# Patient Record
Sex: Female | Born: 1995 | Race: Black or African American | Hispanic: No | Marital: Single | Smoking: Never smoker
Health system: Southern US, Community
[De-identification: ages and names within clinical notes are randomized; demographics above are authoritative.]

## PROBLEM LIST (undated history)

## (undated) DIAGNOSIS — E282 Polycystic ovarian syndrome: Secondary | ICD-10-CM

## (undated) DIAGNOSIS — J45909 Unspecified asthma, uncomplicated: Secondary | ICD-10-CM

## (undated) HISTORY — PX: TONSILLECTOMY: SUR1361

## (undated) HISTORY — DX: Unspecified asthma, uncomplicated: J45.909

---
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
## 2013-03-23 ENCOUNTER — Emergency Department (HOSPITAL_COMMUNITY)
Admission: EM | Admit: 2013-03-23 | Discharge: 2013-03-23 | Disposition: A | Discharge status: Home / Self Care | Payer: 59 | Source: Home / Self Care | Attending: Family Medicine | Admitting: Family Medicine

## 2013-03-23 ENCOUNTER — Encounter (HOSPITAL_COMMUNITY): Payer: Self-pay | Admitting: Emergency Medicine

## 2013-03-23 ENCOUNTER — Emergency Department (INDEPENDENT_AMBULATORY_CARE_PROVIDER_SITE_OTHER): Payer: 59

## 2013-03-23 DIAGNOSIS — R079 Chest pain, unspecified: Secondary | ICD-10-CM

## 2013-03-23 MED ORDER — ALBUTEROL SULFATE (5 MG/ML) 0.5% IN NEBU
5.0000 mg | INHALATION_SOLUTION | Freq: Once | RESPIRATORY_TRACT | Status: AC
  Administered 2013-03-23: 5 mg via RESPIRATORY_TRACT

## 2013-03-23 MED ORDER — ALBUTEROL SULFATE (5 MG/ML) 0.5% IN NEBU
INHALATION_SOLUTION | RESPIRATORY_TRACT | Status: AC
  Filled 2013-03-23: qty 1

## 2013-03-23 MED ORDER — IPRATROPIUM BROMIDE 0.02 % IN SOLN
RESPIRATORY_TRACT | Status: AC
  Filled 2013-03-23: qty 2.5

## 2013-03-23 MED ORDER — IPRATROPIUM BROMIDE 0.02 % IN SOLN
0.5000 mg | Freq: Once | RESPIRATORY_TRACT | Status: AC
  Administered 2013-03-23: 0.5 mg via RESPIRATORY_TRACT

## 2013-03-23 NOTE — ED Notes (Signed)
C/o chest pain for a month feels as if something is closing States she does get SOB States she has been having headaches almost everday She has migraine medication, tries to sleep off headache, and tried other OTC medications.

## 2013-03-23 NOTE — ED Provider Notes (Signed)
Teresa Durham is a 17 y.o. female who presents to Urgent Care today for chest pain and tightness for one month. This is associated with some shortness of breath. She notes decreasing exercise tolerance as well. She has a history of asthma but says that her symptoms are not consistent with prior asthma exacerbation. She's tried albuterol inhaler which did not help. She denies any palpitations or syncope or swelling. The pain is central does not radiate. The pain is not very exertional in nature however she does note decreased tolerance to exertion. She notes that she becomes fatigued with exertion.   History reviewed. No pertinent past medical history. History  Substance Use Topics  . Smoking status: Not on file  . Smokeless tobacco: Not on file  . Alcohol Use: Not on file   ROS as above Medications reviewed. No current facility-administered medications for this encounter.   No current outpatient prescriptions on file.    Exam:  BP 105/70  Pulse 83  Temp(Src) 98.4 F (36.9 C) (Oral)  Resp 16  SpO2 98%  LMP 02/17/2013 Gen: Well NAD HEENT: EOMI,  MMM Lungs: CTABL Nl WOB Heart: RRR no MRG Abd: NABS, NT, ND Exts: Non edematous BL  LE, warm and well perfused.   The patient was given DuoNeb nebulizer treatment and had no subjective or objective improvement.   No results found for this or any previous visit (from the past 24 hour(s)). Dg Chest 2 View  03/23/2013   CLINICAL DATA:  Chest pain with history of asthma.  EXAM: CHEST  2 VIEW  COMPARISON:  None.  FINDINGS: The lungs are borderline hyperinflated. There is no focal infiltrate. There is no pneumothorax nor pneumomediastinum. There is no pleural effusion. The cardiopericardial silhouette is normal in size. The pulmonary vascularity is not engorged. The observed portions of the bony thorax are normal.  IMPRESSION: There is borderline hyperinflation which likely reflects the patient's history of reactive airway disease. There is no  evidence of pneumonia.   Electronically Signed   By: David  [place]   On: 03/23/2013 16:23    Twelve-lead EKG shows normal sinus rhythm 82 beats per minute. No ST segment changes. Otherwise normal.    Assessment and Plan: 17 y.o. female with chest pain and tightness with decreasing exercise tolerance over the last month.  This is somewhat concerning for cardiac etiology.  If the patient would benefit from consultation with cardiology. She may ultimately require echocardiogram or stress test.  It is possible her symptoms are due to a pulmonary etiology however she has been using albuterol both at home and in the urgent care setting and had no benefit.  She is an appointment with cardiology on November 18th at 8:15 AM.  She will return sooner if worsening or present to the emergency room.  Discussed warning signs or symptoms. Please see discharge instructions. Patient expresses understanding.      Rodolph Bong, MD 03/24/13 0730

## 2013-04-04 ENCOUNTER — Ambulatory Visit (INDEPENDENT_AMBULATORY_CARE_PROVIDER_SITE_OTHER): Payer: 59 | Admitting: Internal Medicine

## 2013-04-04 ENCOUNTER — Encounter: Payer: Self-pay | Admitting: Internal Medicine

## 2013-04-04 VITALS — BP 100/62 | HR 78 | Ht 63.0 in | Wt 157.3 lb

## 2013-04-04 DIAGNOSIS — R011 Cardiac murmur, unspecified: Secondary | ICD-10-CM | POA: Insufficient documentation

## 2013-04-04 DIAGNOSIS — M940 Chondrocostal junction syndrome [Tietze]: Secondary | ICD-10-CM | POA: Insufficient documentation

## 2013-04-04 DIAGNOSIS — R0609 Other forms of dyspnea: Secondary | ICD-10-CM

## 2013-04-04 DIAGNOSIS — R0600 Dyspnea, unspecified: Secondary | ICD-10-CM | POA: Insufficient documentation

## 2013-04-04 DIAGNOSIS — R0789 Other chest pain: Secondary | ICD-10-CM | POA: Insufficient documentation

## 2013-04-04 DIAGNOSIS — J45909 Unspecified asthma, uncomplicated: Secondary | ICD-10-CM | POA: Insufficient documentation

## 2013-04-04 MED ORDER — IBUPROFEN 800 MG PO TABS: 800.0000 mg | ORAL_TABLET | Freq: Two times a day (BID) | ORAL | Status: DC | PRN

## 2013-04-04 NOTE — Progress Notes (Signed)
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 She is studying physical therapy.  She reports a few weeks ago that she developed tightness in her left chest with some associated shortness of breath. She is felt to have a history of asthma since childhood and was prescribed Qvar for inflammation in the chest. With regards to her chest discomfort she describes it as sharp and sore in the left chest in between the ribs at the angle of the costal cartilage.  The symptoms of discomfort are provoked with pain or pressure in that area and with movement. They're not necessarily worse with breathing. Her symptoms are not worse with exercise and present more often consistently at rest. She's had no real improvement in her symptoms over the past several weeks. She had an x-ray which showed possible hyperinflation of the lungs but the heart was not borderline enlarged. Her EKG did not show any evidence of left ventricular hypertrophy. There is no family history of sudden cardiac death or significant coronary disease, especially prematurely in the family.  The pain is rated a 2-3/10 and is more of a dull ache.  PMHx:  Past Medical History  Diagnosis Date  . Asthma     Past Surgical History  Procedure Laterality Date  . Tonsillectomy      FAMHx:  No family history on file.  SOCHx:   reports that she has never smoked. She has never used smokeless tobacco. She reports that she does not drink alcohol or use illicit drugs.  ALLERGIES:  No Known Allergies  ROS: A comprehensive review of systems was negative except for: Respiratory: positive for asthma and pleurisy/chest pain Cardiovascular: positive for chest pressure/discomfort  HOME MEDS: Current Outpatient Prescriptions  Medication Sig Dispense Refill  . QVAR 80  MCG/ACT inhaler Inhale 1 puff into the lungs 2 (two) times daily.      Marland Kitchen ibuprofen (ADVIL,MOTRIN) 800 MG tablet Take 1 tablet (800 mg total) by mouth 2 (two) times daily as needed.  60 tablet  3   No current facility-administered medications for this visit.    LABS/IMAGING: No results found for this or any previous visit (from the past 48 hour(s)). No results found.  VITALS: BP 100/62  Pulse 78  Ht 5\' 3"  (1.6 m)  Wt 157 lb 4.8 oz (71.351 kg)  BMI 27.87 kg/m2  LMP 02/17/2013  EXAM: General appearance: alert and no distress Neck: no carotid bruit and no JVD Lungs: clear to auscultation bilaterally, there is reproducible chest discomfort along the left sternal margin Heart: regular rate and rhythm, S1, S2 normal, 2/6 diastolic murmur at the RUSB, no click, rub or gallop Abdomen: soft, non-tender; bowel sounds normal; no masses,  no organomegaly Extremities: extremities normal, atraumatic, no cyanosis or edema Pulses: 2+ and symmetric Skin: Skin color, texture, turgor normal. No rashes or lesions Neurologic: Grossly normal Psych: Normal, pleasant  EKG: Normal sinus rhythm at 78  ASSESSMENT: 1. Probable costochondritis 2. Asthma on Qvar 3. Aortic murmur - possible AI  PLAN: 1.   [name] most likely has a viral costochondritis. I recommended that she take ibuprofen 800 mg twice daily for at least one week and that she take Zantac in addition to this given her history of reflux to try to protect her stomach. Hopefully this will help  reduce the inflammation in her chest wall. Incidentally, she does have an aortic murmur which sounded diastolic. I would like to check an echocardiogram to evaluate for possible mild aortic insufficiency.  This will also help rule out any possible cardiomegaly as this was a concern for her visit today, or possible pericardial effusion which could be causing some of her chest discomfort.  Plan to see her back in 2 weeks to review the results of her  echocardiogram.  Thanks again for the referral.  Chrystie Nose, MD, [facility] Attending Cardiologist [facility]  Fayez Sturgell C 04/04/2013, 5:02 PM

## 2013-04-04 NOTE — Patient Instructions (Signed)
Your physician has requested that you have an echocardiogram. Echocardiography is a painless test that uses sound waves to create images of your heart. It provides your doctor with information about the size and shape of your heart and how well your heart's chambers and valves are working. This procedure takes approximately one hour. There are no restrictions for this procedure.  Take ibuprofen 800mg  twice daily as needed - this has been sent to your pharmacy.  Dr. Rennis Golden recommended that you try Zantac 150mg  OTC daily for reflux symptoms.   Your physician recommends that you schedule a follow-up appointment in: 2-3 weeks, after your test.

## 2013-04-24 ENCOUNTER — Ambulatory Visit (HOSPITAL_COMMUNITY): Payer: 59

## 2013-05-01 ENCOUNTER — Ambulatory Visit (HOSPITAL_COMMUNITY): Payer: 59

## 2013-05-29 ENCOUNTER — Ambulatory Visit: Payer: 59 | Admitting: Internal Medicine

## 2013-09-29 ENCOUNTER — Emergency Department (HOSPITAL_COMMUNITY): Payer: 59

## 2013-09-29 ENCOUNTER — Encounter (HOSPITAL_COMMUNITY): Payer: Self-pay | Admitting: Emergency Medicine

## 2013-09-29 ENCOUNTER — Emergency Department (HOSPITAL_COMMUNITY)
Admission: EM | Admit: 2013-09-29 | Discharge: 2013-09-29 | Disposition: A | Discharge status: Home / Self Care | Payer: 59 | Attending: Emergency Medicine | Admitting: Emergency Medicine

## 2013-09-29 DIAGNOSIS — S99929A Unspecified injury of unspecified foot, initial encounter: Secondary | ICD-10-CM

## 2013-09-29 DIAGNOSIS — Z041 Encounter for examination and observation following transport accident: Secondary | ICD-10-CM

## 2013-09-29 DIAGNOSIS — Y92410 Unspecified street and highway as the place of occurrence of the external cause: Secondary | ICD-10-CM | POA: Insufficient documentation

## 2013-09-29 DIAGNOSIS — S8990XA Unspecified injury of unspecified lower leg, initial encounter: Secondary | ICD-10-CM | POA: Insufficient documentation

## 2013-09-29 DIAGNOSIS — S46909A Unspecified injury of unspecified muscle, fascia and tendon at shoulder and upper arm level, unspecified arm, initial encounter: Secondary | ICD-10-CM | POA: Insufficient documentation

## 2013-09-29 DIAGNOSIS — S0990XA Unspecified injury of head, initial encounter: Secondary | ICD-10-CM | POA: Insufficient documentation

## 2013-09-29 DIAGNOSIS — Y9389 Activity, other specified: Secondary | ICD-10-CM | POA: Insufficient documentation

## 2013-09-29 DIAGNOSIS — Z79899 Other long term (current) drug therapy: Secondary | ICD-10-CM | POA: Insufficient documentation

## 2013-09-29 DIAGNOSIS — S4980XA Other specified injuries of shoulder and upper arm, unspecified arm, initial encounter: Secondary | ICD-10-CM | POA: Insufficient documentation

## 2013-09-29 DIAGNOSIS — J45909 Unspecified asthma, uncomplicated: Secondary | ICD-10-CM | POA: Insufficient documentation

## 2013-09-29 DIAGNOSIS — Z3202 Encounter for pregnancy test, result negative: Secondary | ICD-10-CM | POA: Insufficient documentation

## 2013-09-29 DIAGNOSIS — S99919A Unspecified injury of unspecified ankle, initial encounter: Secondary | ICD-10-CM

## 2013-09-29 LAB — POC URINE PREG, ED: PREG TEST UR: NEGATIVE

## 2013-09-29 MED ORDER — HYDROCODONE-ACETAMINOPHEN 5-325 MG PO TABS: 1.0000 | ORAL_TABLET | Freq: Four times a day (QID) | ORAL | Status: DC | PRN

## 2013-09-29 MED ORDER — HYDROCODONE-ACETAMINOPHEN 5-325 MG PO TABS
1.0000 | ORAL_TABLET | Freq: Once | ORAL | Status: AC
  Administered 2013-09-29: 1 via ORAL
  Filled 2013-09-29: qty 1
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
## 2013-09-29 NOTE — Discharge Instructions (Signed)
It was a pleasure taking care of you. - I have given you a short course of pain medicine. - Please establish a PCP using the contact information above and see him or her for ED follow up in 3-5 days. - If you develop worsening pain, confusion, weakness, numbness please call your Dr. or return to the ED.
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
## 2013-10-04 ENCOUNTER — Ambulatory Visit (INDEPENDENT_AMBULATORY_CARE_PROVIDER_SITE_OTHER): Payer: 59 | Admitting: Medical

## 2013-10-04 ENCOUNTER — Encounter: Payer: Self-pay | Admitting: Medical

## 2013-10-04 ENCOUNTER — Encounter: Payer: Self-pay | Admitting: Family Medicine

## 2013-10-04 DIAGNOSIS — S99919A Unspecified injury of unspecified ankle, initial encounter: Secondary | ICD-10-CM

## 2013-10-04 DIAGNOSIS — S99929A Unspecified injury of unspecified foot, initial encounter: Secondary | ICD-10-CM

## 2013-10-04 DIAGNOSIS — S8990XA Unspecified injury of unspecified lower leg, initial encounter: Secondary | ICD-10-CM

## 2013-10-04 DIAGNOSIS — M79671 Pain in right foot: Secondary | ICD-10-CM

## 2013-10-04 DIAGNOSIS — M79609 Pain in unspecified limb: Secondary | ICD-10-CM

## 2013-10-04 DIAGNOSIS — S93609A Unspecified sprain of unspecified foot, initial encounter: Secondary | ICD-10-CM
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
## 2013-10-10 ENCOUNTER — Telehealth: Payer: Self-pay | Admitting: Medical

## 2013-10-10 NOTE — Telephone Encounter (Signed)
I completed my part on her FMLA forms. CLS

## 2013-10-10 NOTE — Telephone Encounter (Signed)
Pt dropped off a return to work form to be completed for her job. Please complete form and fax to highlighted number at the top of form and then call pt to pick up. I am sending form back to [place] for completion. please call pt at [phone] when complete.

## 2013-10-11 NOTE — Telephone Encounter (Signed)
I already took the time to complete a whole FMLA form, and I now I have received this additional form.  The other form should suffice, please check on this.

## 2013-10-11 NOTE — Telephone Encounter (Signed)
lmom to cb. cls 

## 2013-10-12 ENCOUNTER — Ambulatory Visit: Payer: 59 | Admitting: Family Medicine

## 2013-10-17 NOTE — Telephone Encounter (Signed)
LMOM TO CB. CLS 

## 2013-11-14 NOTE — Progress Notes (Signed)
 Subjective   Patient ID:  Teresa Durham is a 18 y.o. (DOB 03-09-1996) female.     Patient presents with  . Cough    started 3 days ago, everyone in house hold has URI, over the past month  . Sinusitis    sinus pressure at cheeks, nasal congestion... is accusomed to this becoming bronchitis.  . Sore Throat    her sister was diagnosed with strept throat 4 weeks ago.  . Chest Congestion  . Fever    101 lastnight  . Nasal Congestion    Past Medical History, Past Surgery History, Allergies, Social History,  were reviewed and updated.    Review of Systems is negative except as noted.  Objective   BP 114/62  Pulse 85  Temp(Src) 98.5 F (36.9 C) (Oral)  Resp 16  Ht 5' 3.25 (1.607 m)  Wt 169 lb (76.658 kg)  BMI 29.68 kg/m2  LMP 10/13/2013 General:  Well Developed, Well Nourished, No distress Head, Eyes, Ears, Nose, Throat: Normocephalic, atraumatic, Conjunctiva normal, Nares edematous turbinates, NT at maxillas, TMs NL AU, Oropharynx moist and clear without erythema Neck:  No thyromegally, No lymphadenopathy  Cardiovascular: regular, rate, and rhythm without murmurs Lungs:  No respiratory distress, Clear to auscultation with normal effort Skin:  No Focal Rashes  Neurologic:  No focal deficits     Assessment   1. Pharyngitis   2. Acute sinusitis, recurrence not specified, unspecified location     Plan   Patient's Medications  New Prescriptions   AMOXICILLIN  (AMOXIL ) 500 MG CAPSULE    Take 2 capsules (1,000 mg total) by mouth 2 (two) times daily.  Previous Medications   ALBUTEROL  SULFATE HFA IN    Inhale into the lungs.   ETONOGESTREL (NEXPLANON) 68 MG IMPL    Inject into the skin.  Modified Medications   No medications on file  Discontinued Medications   ALBUTEROL  (PROVENTIL ) 2.5 MG/3 ML NEBULIZER SOLUTION    Take 2.5 mg by nebulization every 6 (six) hours as needed for Wheezing.   UNKNOWN TO PATIENT    Implant in arm for birthcontrol    No orders of the defined  types were placed in this encounter.   Plan follow-up as discussed or as needed if any worsening symptoms or change in condition, and patient expressed understanding .

## 2013-12-01 NOTE — Progress Notes (Signed)
 Subjective   Patient ID:  Teresa Durham is a 18 y.o. (DOB 12-14-1995) female.     Patient presents with  . Follow-up URI    Patient was seen on 6/30 and treated for pharyngitis and sinusitis with Amoxil .  Patient is still having cough, congestion, and runny nose.  Trouble sleeping at night due to congestion.  Does not feel bad.  She has also been using Mucinex and OTC nasal steroid without improvement.    Past Medical History, Past Surgery History, Allergies, Social History, and Family History were reviewed and updated.    Review of Systems is complete and negative except as noted.  Objective   BP 104/70  Pulse 76  Temp(Src) 97.8 F (36.6 C) (Tympanic)  Resp 18  Wt 173 lb (78.472 kg)  SpO2 99%  LMP 10/13/2013 General:  Well Developed, Well Nourished, No distress HEENT: Normocephalic, atraumatic, PERRLA, Conjunctiva normal, Bilateral EAC and TM normal, swollen nares, no sinus tenderness, Oropharynx moist and clear Neck:  Supple with normal range of motion, No LAD  CV:  S1S2, RRR without MGR  Lungs:  CTA with normal effort Neuro:  No focal deficits    Impression   1. Rhinitis, nonallergic     Plan   Patient's Medications  New Prescriptions   METHYLPREDNISOLONE  (METHYLPREDNISOLONE ) 4 MG TABLET    follow package directions  Previous Medications   ALBUTEROL  SULFATE HFA IN    Inhale into the lungs.   ETONOGESTREL (NEXPLANON) 68 MG IMPL    Inject into the skin.  Modified Medications   No medications on file  Discontinued Medications   No medications on file    No orders of the defined types were placed in this encounter.    PE normal except swollen turbinates.  Starting Medrol  dose pack to help with nasal congestion Advised she could also try OTC Afrin, but to use less than 5 days  Risks, benefits, and alternatives of the medications and treatment plan prescribed today were discussed, and patient expressed understanding. Plan follow-up as discussed or as needed if any  worsening symptoms or change in condition.    A yearly preventative health exam was recommended and current age based recommendations were discussed.

## 2015-03-05 IMAGING — CR DG CHEST 2V
2 series · 2 of 2 positions shown · non-contrast
Comparison: None.

CLINICAL DATA: Chest pain with history of asthma.

EXAM:
CHEST  2 VIEW

[view not recorded (1 of 2)]
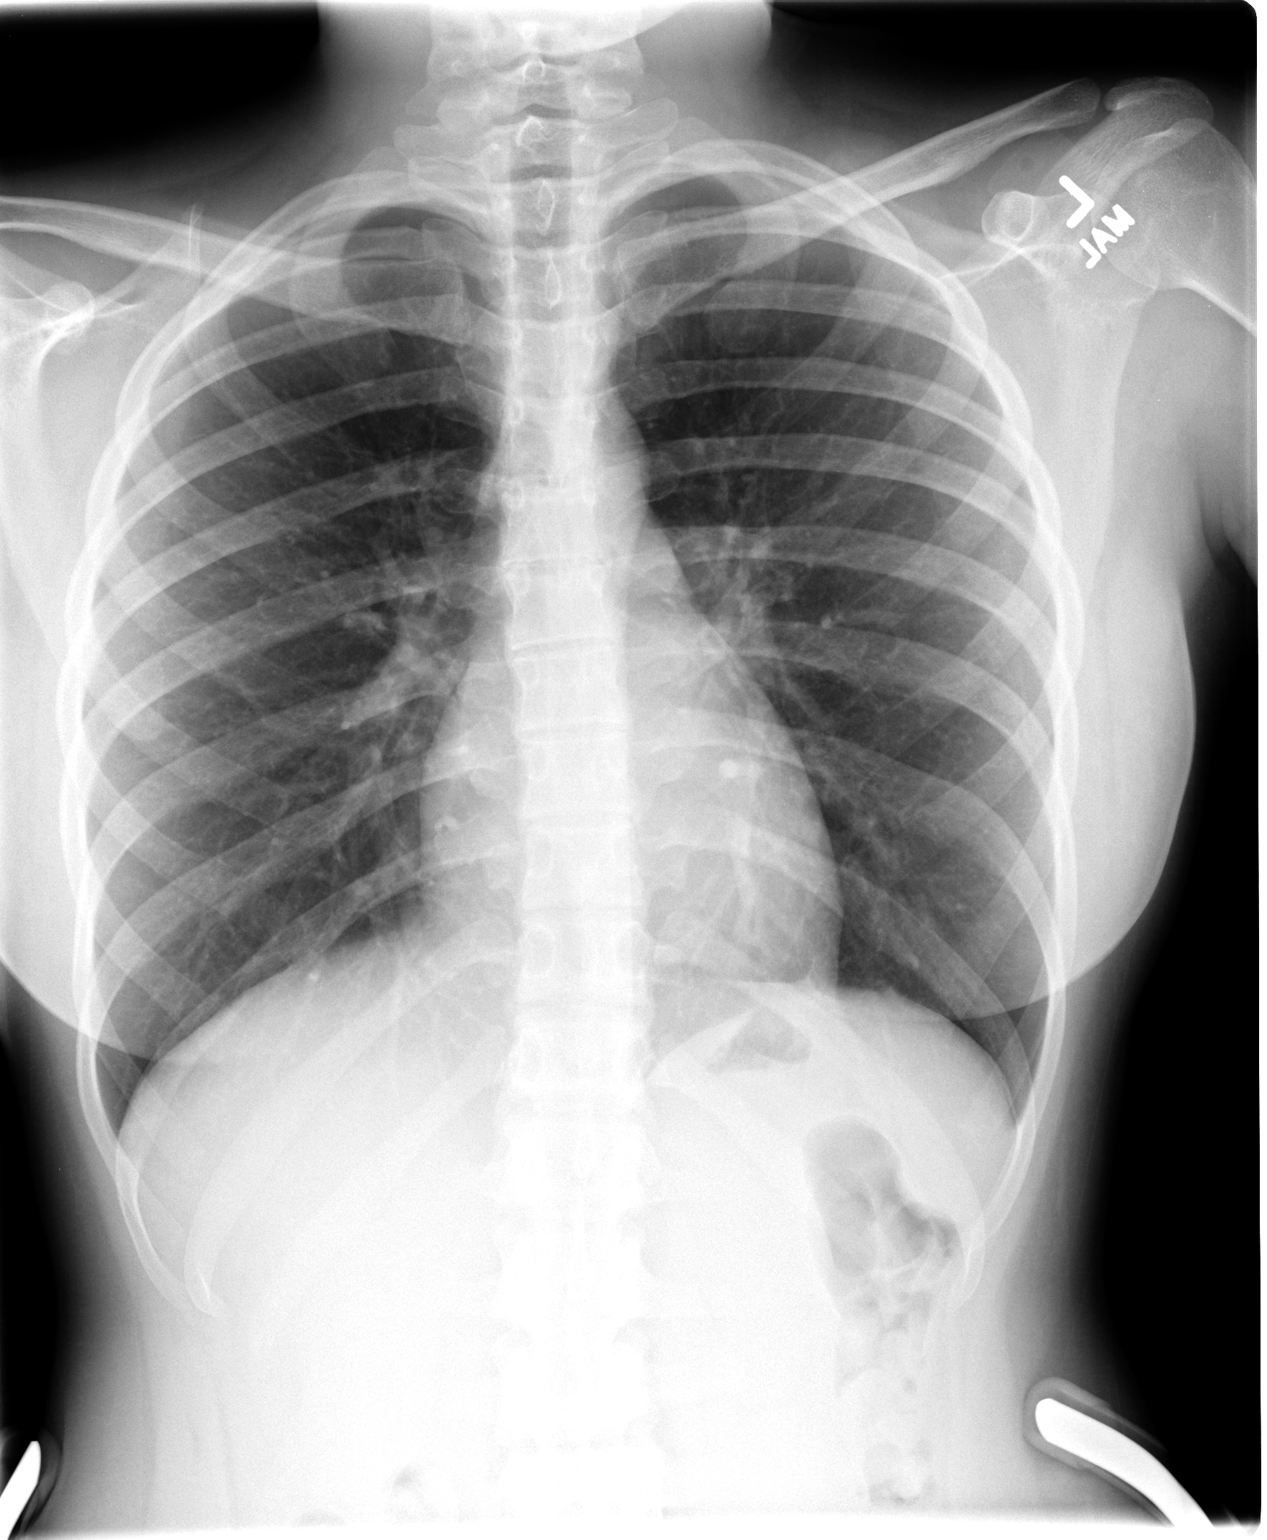

[view not recorded (2 of 2)]
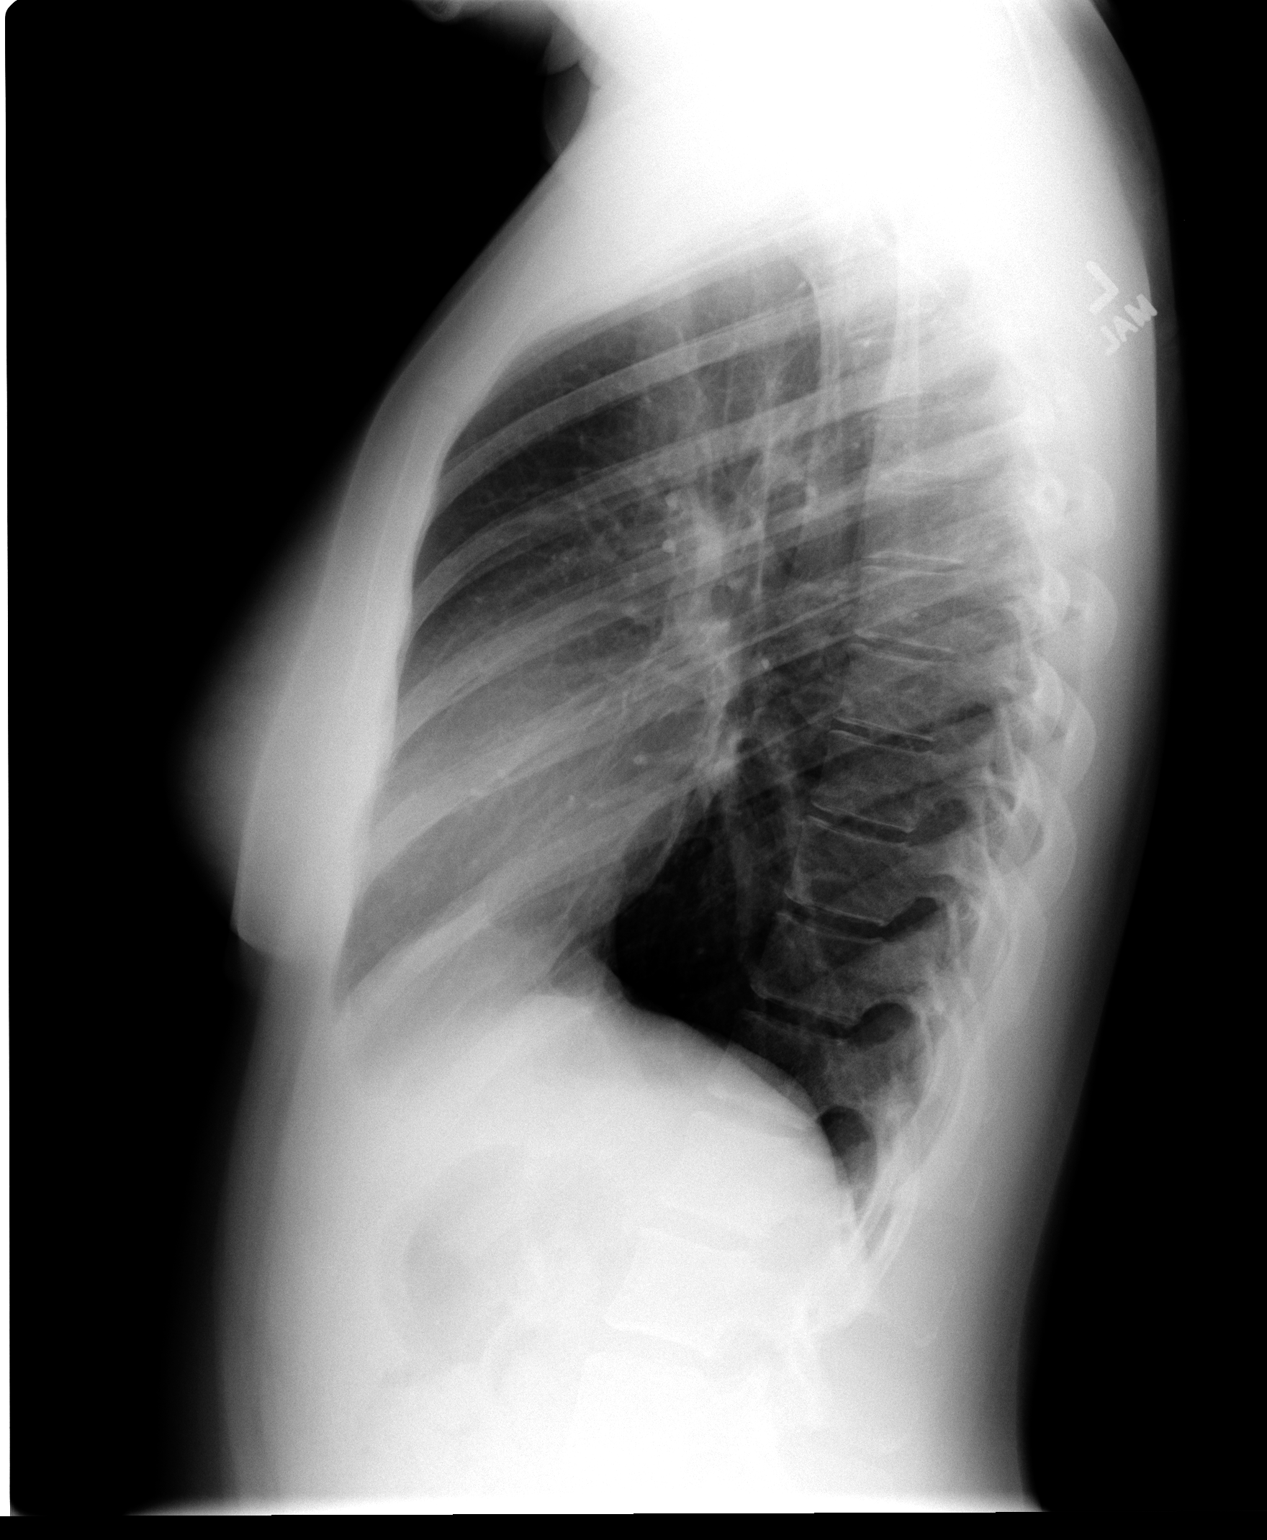

[2 of 2 positions shown; findings below may reference images not displayed]

FINDINGS: The lungs are borderline hyperinflated. There is no focal
infiltrate. There is no pneumothorax nor pneumomediastinum. There is
no pleural effusion. The cardiopericardial silhouette is normal in
size. The pulmonary vascularity is not engorged. The observed
portions of the bony thorax are normal.
IMPRESSION: There is borderline hyperinflation which likely reflects the
patient's history of reactive airway disease. There is no evidence
of pneumonia.

## 2015-03-26 NOTE — Progress Notes (Signed)
 Subjective   Patient ID:  Teresa Durham is a 19 y.o. (DOB 1996/04/06) female.      Patient presents with  . eye redness    started today in left eye; exposure to pink eye  BF c/o red left eye since this am. Boyfriend with similar over past 1-2 weeks. Was seen here for same. Given 2 rounds of gtts before sxs improved  Denies trauma, tearing, visual changes, FB sensation  Admits to +/- light sensitive, +/- itch     HPI  Past Medical History, Past Surgical History, Past Family History, Social History, Medications, and Allergies were reviewed and updated as appropriate.  Review of Systems Review of Systems   Objective   BP 115/60 (BP Location: Right arm, Patient Position: Sitting)  Pulse 91  Temp 98.2 F (36.8 C) (Oral)   Ht 5' 3 (1.6 m)  Wt 216 lb 11.2 oz (98.3 kg)  LMP 03/18/2015 (Exact Date)  SpO2 98%  BMI 38.39 kg/m2  Physical Exam  Constitutional: She is oriented to person, place, and time. She appears well-developed and well-nourished. No distress.  HENT:  Mouth/Throat: Oropharynx is clear and moist.  TM's wnl  Eyes: Pupils are equal, round, and reactive to light.  Injected left conjunctiva, no FB, anterior chamber wnl, increased cobblestoning left  Neck: Normal range of motion. Neck supple.  Lymphadenopathy:    She has no cervical adenopathy.  Neurological: She is alert and oriented to person, place, and time.  Skin: Skin is warm.  Psychiatric: She has a normal mood and affect.    ---------------------------  No results found for this or any previous visit (from the past 12 hour(s)).   Impression   1. Acute bacterial conjunctivitis of left eye       Plan   See encounter after visit summary for patient specific instructions.  Return for recheck if not resolved, but sooner if worsen.  Instructions given how to appropriately place eye drops  No orders of the defined types were placed in this encounter.  Patient's Medications  New Prescriptions    MOXIFLOXACIN HCL (VIGAMOX) 0.5% OPHTHALMIC SOLUTION    Place one drop into the left eye 3 (three) times a day.  Previous Medications   ALBUTEROL  SULFATE HFA IN    Inhale into the lungs.   ETONOGESTREL (NEXPLANON) 68 MG IMPL    Inject into the skin.   METHYLPREDNISOLONE  (METHYLPREDNISOLONE ) 4 MG TABLET    follow package directions  Modified Medications   No medications on file  Discontinued Medications   No medications on file    Risks, benefits, and alternatives of the medications and treatment plan prescribed today were discussed, and patient expressed understanding. Plan follow-up as discussed or as needed if any worsening symptoms or change in condition.    A yearly health maintenance exam was recommended where appropriate.

## 2016-06-11 ENCOUNTER — Emergency Department (HOSPITAL_BASED_OUTPATIENT_CLINIC_OR_DEPARTMENT_OTHER)
Admission: EM | Admit: 2016-06-11 | Discharge: 2016-06-11 | Disposition: A | Discharge status: Home / Self Care | Payer: 59 | Attending: Emergency Medicine | Admitting: Emergency Medicine

## 2016-06-11 ENCOUNTER — Encounter (HOSPITAL_BASED_OUTPATIENT_CLINIC_OR_DEPARTMENT_OTHER): Payer: Self-pay | Admitting: *Deleted

## 2016-06-11 DIAGNOSIS — J45909 Unspecified asthma, uncomplicated: Secondary | ICD-10-CM | POA: Diagnosis not present

## 2016-06-11 DIAGNOSIS — Z791 Long term (current) use of non-steroidal anti-inflammatories (NSAID): Secondary | ICD-10-CM | POA: Diagnosis not present

## 2016-06-11 DIAGNOSIS — R05 Cough: Secondary | ICD-10-CM | POA: Diagnosis present

## 2016-06-11 DIAGNOSIS — J111 Influenza due to unidentified influenza virus with other respiratory manifestations: Secondary | ICD-10-CM | POA: Diagnosis not present

## 2016-06-11 DIAGNOSIS — Z79899 Other long term (current) drug therapy: Secondary | ICD-10-CM | POA: Diagnosis not present

## 2016-06-11 MED ORDER — KETOROLAC TROMETHAMINE 30 MG/ML IJ SOLN
30.0000 mg | Freq: Once | INTRAMUSCULAR | Status: AC
  Administered 2016-06-11: 30 mg via INTRAVENOUS
  Filled 2016-06-11: qty 1

## 2016-06-11 MED ORDER — ONDANSETRON 4 MG PO TBDP
4.0000 mg | ORAL_TABLET | Freq: Once | ORAL | Status: AC
  Administered 2016-06-11: 4 mg via ORAL
  Filled 2016-06-11: qty 1

## 2016-06-11 NOTE — ED Triage Notes (Signed)
To ED via EMS. She was diagnosed with flu yesterday and started on Tamaflu. She has had 3 doses. States she feels worse today than yesterday. She is in no distress at triage. Able to answer questions.

## 2016-06-11 NOTE — ED Notes (Signed)
Pt verbalizes understanding of d/c instructions and denies any further needs at this time. 

## 2016-06-11 NOTE — ED Provider Notes (Signed)
[facility] Note   CSN: [id number] Arrival date & time: 06/11/16  1408   By signing my name below, I, Teresa Durham, attest that this documentation has been prepared under the direction and in the presence of [name], [place]. Electronically Signed: Clarisse Durham, Scribe. 06/11/16. 4:59 PM.   History   Chief Complaint Chief Complaint  Patient presents with  . Influenza   The history is provided by the patient and medical records. No language interpreter was used.    HPI Comments: Teresa Durham is an otherwise healthy 21 y.o. female who presents to the Emergency Department complaining of progressive fever and lightheadedness x 3 days. Pt reports associated body aches, rhinorrhea, postnasal drip, vomiting and diarrhea productive cough. She states she was seen at [facility] on 06/10/2016, where she was diagnosed with the flu and prescribed tamiflu. Triage notes pt has taken 3 doses of tamiflu. It also states she felt worse today than yesterday. She states she took tylenol today, and it relieved her fever. Pt states that N/V/D exacerbated when attempting to drink fluids and she has only had crackers morning, but she reports hunger on evaluation. She reports sick contacts at work and no flu vaccine today. NKDA.   Past Medical History:  Diagnosis Date  . Asthma     Patient Active Problem List   Diagnosis Date Noted  . Murmur 04/04/2013  . Chest pressure 04/04/2013  . Dyspnea 04/04/2013  . Asthma 04/04/2013  . Costochondritis 04/04/2013    Past Surgical History:  Procedure Laterality Date  . TONSILLECTOMY      OB History    No data available       Home Medications    Prior to Admission medications   Medication Sig Start Date End Date Taking? Authorizing Provider  albuterol (PROVENTIL HFA;VENTOLIN HFA) 108 (90 BASE) MCG/ACT inhaler Inhale into the lungs every 6 (six) hours as needed for wheezing or shortness of breath.   Yes Historical Provider,  MD  etonogestrel (NEXPLANON) 68 MG IMPL implant Inject 1 each into the skin once.   Yes Historical Provider, MD  ibuprofen (ADVIL,MOTRIN) 800 MG tablet Take 1 tablet (800 mg total) by mouth 2 (two) times daily as needed. 04/04/13  Yes Chrystie Nose, MD  cetirizine (ZYRTEC) 10 MG tablet Take 10 mg by mouth daily.    Historical Provider, MD  HYDROcodone-acetaminophen (NORCO/VICODIN) 5-325 MG per tablet Take 1 tablet by mouth every 6 (six) hours as needed for moderate pain. 09/29/13   Lacie Scotts, MD    Family History No family history on file.  Social History Social History  Substance Use Topics  . Smoking status: Never Smoker  . Smokeless tobacco: Never Used  . Alcohol use No     Allergies   Patient has no known allergies.   Review of Systems Review of Systems A complete 10 system review of systems was obtained and all systems are negative except as noted in the HPI and PMH.    Physical Exam Updated Vital Signs BP 98/64 (BP Location: Right Arm)   Pulse 109   Temp 98.6 F (37 C) (Oral)   Resp 18   Ht 5\' 3"  (1.6 m)   Wt 184 lb (83.5 kg)   LMP 06/04/2016   SpO2 95%   BMI 32.59 kg/m   Physical Exam  Constitutional: She is oriented to person, place, and time. Vital signs are normal. She appears well-developed and well-nourished.  Non-toxic appearance. No distress.  Afebrile,  nontoxic, NAD  HENT:  Head: Normocephalic and atraumatic.  Mouth/Throat: Mucous membranes are normal.  Eyes: Conjunctivae and EOM are normal. Right eye exhibits no discharge. Left eye exhibits no discharge.  Neck: Normal range of motion. Neck supple.  Cardiovascular: Normal rate and intact distal pulses.   Pulmonary/Chest: Effort normal. No respiratory distress.  Abdominal: Normal appearance. She exhibits no distension.  Musculoskeletal: Normal range of motion.  Neurological: She is alert and oriented to person, place, and time. She has normal strength. No sensory deficit.  Skin: Skin is warm,  dry and intact. No rash noted.  Psychiatric: She has a normal mood and affect. Her behavior is normal.  Nursing note and vitals reviewed.    ED Treatments / Results  DIAGNOSTIC STUDIES: Oxygen Saturation is 95% on RA, adequate by my interpretation.    COORDINATION OF CARE: 4:43 PM Discussed treatment plan with pt at bedside and pt agreed to plan.  Labs (all labs ordered are listed, but only abnormal results are displayed) Labs Reviewed - No data to display  EKG  EKG Interpretation None       Radiology No results found.  Procedures Procedures (including critical care time)  Medications Ordered in ED Medications - No data to display   Initial Impression / Assessment and Plan / ED Course  I have reviewed the triage vital signs and the nursing notes.  Pertinent labs & imaging results that were available during my care of the patient were reviewed by me and considered in my medical decision making (see chart for details).       Final Clinical Impressions(s) / ED Diagnoses   Final diagnoses:  Influenza    New Prescriptions Discharge Medication List as of 06/11/2016  7:33 PM    An After Visit Summary was printed and given to the patient.  I personally performed the services in this documentation, which was scribed in my presence.  The recorded information has been reviewed and considered.   Barnet PallKaren SofiaPAC.   Lonia SkinnerLeslie K ChaumontSofia, PA-C 06/12/16 0008    Vanetta MuldersScott Zackowski, MD 06/15/16 [phone]

## 2016-06-11 NOTE — Discharge Instructions (Signed)
See your Physician for recheck.  °

## 2017-03-02 ENCOUNTER — Telehealth (HOSPITAL_BASED_OUTPATIENT_CLINIC_OR_DEPARTMENT_OTHER): Payer: Self-pay | Admitting: *Deleted

## 2017-03-02 ENCOUNTER — Encounter (HOSPITAL_BASED_OUTPATIENT_CLINIC_OR_DEPARTMENT_OTHER): Payer: Self-pay

## 2017-03-02 ENCOUNTER — Ambulatory Visit (HOSPITAL_BASED_OUTPATIENT_CLINIC_OR_DEPARTMENT_OTHER)
Admission: RE | Admit: 2017-03-02 | Discharge: 2017-03-02 | Disposition: A | Discharge status: Home / Self Care | Payer: 59 | Source: Ambulatory Visit | Attending: Emergency Medicine | Admitting: Emergency Medicine

## 2017-03-02 ENCOUNTER — Emergency Department (HOSPITAL_BASED_OUTPATIENT_CLINIC_OR_DEPARTMENT_OTHER)
Admission: EM | Admit: 2017-03-02 | Discharge: 2017-03-02 | Disposition: A | Discharge status: Home / Self Care | Payer: 59 | Source: Home / Self Care | Attending: Emergency Medicine | Admitting: Emergency Medicine

## 2017-03-02 DIAGNOSIS — R102 Pelvic and perineal pain: Secondary | ICD-10-CM | POA: Insufficient documentation

## 2017-03-02 DIAGNOSIS — N939 Abnormal uterine and vaginal bleeding, unspecified: Secondary | ICD-10-CM | POA: Diagnosis present

## 2017-03-02 DIAGNOSIS — Z79899 Other long term (current) drug therapy: Secondary | ICD-10-CM | POA: Insufficient documentation

## 2017-03-02 DIAGNOSIS — J45909 Unspecified asthma, uncomplicated: Secondary | ICD-10-CM | POA: Insufficient documentation

## 2017-03-02 HISTORY — DX: Polycystic ovarian syndrome: E28.2

## 2017-03-02 LAB — URINALYSIS, MICROSCOPIC (REFLEX): WBC, UA: NONE SEEN WBC/hpf (ref 0–5)

## 2017-03-02 LAB — URINALYSIS, ROUTINE W REFLEX MICROSCOPIC
Bilirubin Urine: NEGATIVE
Glucose, UA: NEGATIVE mg/dL
Ketones, ur: NEGATIVE mg/dL
Leukocytes, UA: NEGATIVE
Nitrite: NEGATIVE
PH: 7 (ref 5.0–8.0)
Protein, ur: NEGATIVE mg/dL
SPECIFIC GRAVITY, URINE: 1.02 (ref 1.005–1.030)

## 2017-03-02 LAB — PREGNANCY, URINE: PREG TEST UR: NEGATIVE

## 2017-03-02 NOTE — Discharge Instructions (Signed)
Ibuprofen 600 mg every 6 hours as needed for pain.  Return tomorrow at the given time for an ultrasound to further evaluate your ovaries and uterus.

## 2017-03-02 NOTE — ED Triage Notes (Signed)
Pt states she was having intercourse two nights ago and experienced severe pelvic pressure.  On Sunday she started having vaginal bleeding.  Pt has hx of PCOS so is unsure if she is starting her period or not.  Pt states the pain has subsided significantly, denies vaginal discharge, denies dysuria.

## 2017-03-02 NOTE — ED Notes (Signed)
Pt verbalizes understanding of d/c instructions and denies any further needs at this time. 

## 2017-03-02 NOTE — Telephone Encounter (Signed)
Pt seen at [facility] this morning for outpatient ultrasound. Results given to Dr. Adela Lank. Spoke with patient and advised [place] was unremarkable (per Dr. Adela Lank) and she should follow up with her PCP. Pt verbalized understanding.

## 2017-03-02 NOTE — ED Provider Notes (Signed)
[facility] EMERGENCY DEPARTMENT Provider Note   CSN: [id number] Arrival date & time: 03/02/17  0012     History   Chief Complaint Chief Complaint  Patient presents with  . Abdominal Pain    HPI Teresa Durham is a 21 y.o. female.  Patient is a 21 year old female with history of polycystic ovaries presenting for evaluation of pelvic pain and vaginal bleeding. This started shortly after intercourse 2 evenings ago. She denies any discomfort during intercourse, however several hours later began to have lower abdominal cramping followed by bleeding. She denies any fevers or chills. She denies having experienced any vaginal discharge. She has the Nexplanon device for contraception and her areas are somewhat irregular. She has had not had a period in 2 months.   The history is provided by the patient.  Abdominal Pain   This is a new problem. The current episode started 2 days ago. The problem occurs constantly. The problem has not changed since onset.The pain is located in the suprapubic region. The quality of the pain is cramping. The pain is moderate. Pertinent negatives include fever, hematochezia, constipation, dysuria and frequency. Nothing aggravates the symptoms. Nothing relieves the symptoms.    Past Medical History:  Diagnosis Date  . Asthma   . PCOS (polycystic ovarian syndrome)     Patient Active Problem List   Diagnosis Date Noted  . Murmur 04/04/2013  . Chest pressure 04/04/2013  . Dyspnea 04/04/2013  . Asthma 04/04/2013  . Costochondritis 04/04/2013    Past Surgical History:  Procedure Laterality Date  . TONSILLECTOMY      OB History    No data available       Home Medications    Prior to Admission medications   Medication Sig Start Date End Date Taking? Authorizing Provider  albuterol (PROVENTIL HFA;VENTOLIN HFA) 108 (90 BASE) MCG/ACT inhaler Inhale into the lungs every 6 (six) hours as needed for wheezing or shortness of breath.     [provider]  cetirizine (ZYRTEC) 10 MG tablet Take 10 mg by mouth daily.    [provider]  etonogestrel (NEXPLANON) 68 MG IMPL implant Inject 1 each into the skin once.    [provider]  HYDROcodone-acetaminophen (NORCO/VICODIN) 5-325 MG per tablet Take 1 tablet by mouth every 6 (six) hours as needed for moderate pain. 09/29/13   Cater, Luis Abed, MD  ibuprofen (ADVIL,MOTRIN) 800 MG tablet Take 1 tablet (800 mg total) by mouth 2 (two) times daily as needed. 04/04/13   Chrystie Nose, MD    Family History No family history on file.  Social History Social History  Substance Use Topics  . Smoking status: Never Smoker  . Smokeless tobacco: Never Used  . Alcohol use No     Allergies   Patient has no known allergies.   Review of Systems Review of Systems  Constitutional: Negative for fever.  Gastrointestinal: Positive for abdominal pain. Negative for constipation and hematochezia.  Genitourinary: Negative for dysuria and frequency.  All other systems reviewed and are negative.    Physical Exam Updated Vital Signs BP 115/72 (BP Location: Left Arm)   Pulse 79   Temp 98.1 F (36.7 C) (Oral)   Resp 18   Ht  (1.6 m)   Wt 83.9 kg (185 lb)   LMP 02/28/2017   SpO2 100%   BMI 32.77 kg/m   Physical Exam  Constitutional: She is oriented to person, place, and time. She appears well-developed and well-nourished. No distress.  HENT:  Head: Normocephalic and atraumatic.  Neck: Normal range of motion. Neck supple.  Cardiovascular: Normal rate and regular rhythm.  Exam reveals no gallop and no friction rub.   No murmur heard. Pulmonary/Chest: Effort normal and breath sounds normal. No respiratory distress. She has no wheezes.  Abdominal: Soft. Bowel sounds are normal. She exhibits no distension. There is tenderness.  There is mild tenderness in the suprapubic region.  Musculoskeletal: Normal range of motion.  Neurological: She is alert and  oriented to person, place, and time.  Skin: Skin is warm and dry. She is not diaphoretic.  Nursing note and vitals reviewed.    ED Treatments / Results  Labs (all labs ordered are listed, but only abnormal results are displayed) Labs Reviewed  URINALYSIS, ROUTINE W REFLEX MICROSCOPIC - Abnormal; Notable for the following:       Result Value   APPearance CLOUDY (*)    Hgb urine dipstick LARGE (*)    All other components within normal limits  URINALYSIS, MICROSCOPIC (REFLEX) - Abnormal; Notable for the following:    Bacteria, UA RARE (*)    Squamous Epithelial / LPF 6-30 (*)    All other components within normal limits  PREGNANCY, URINE    EKG  EKG Interpretation None       Radiology No results found.  Procedures Procedures (including critical care time)  Medications Ordered in ED Medications - No data to display   Initial Impression / Assessment and Plan / ED Course  I have reviewed the triage vital signs and the nursing notes.  Pertinent labs & imaging results that were available during my care of the patient were reviewed by me and considered in my medical decision making (see chart for details).  Pregnancy test is negative and urinalysis does not reflect infection. I suspect this patient is having a menstrual period and highly doubt other pathology such as torsion or ruptured cyst. As her discomfort and bleeding have been ongoing for 2 days, I will have her return in the morning for an ultrasound to further evaluate the pelvic structures.  Final Clinical Impressions(s) / ED Diagnoses   Final diagnoses:  None    New Prescriptions New Prescriptions   No medications on file     Geoffery Lyons, MD 03/02/17 [phone]

## 2017-10-15 ENCOUNTER — Other Ambulatory Visit: Payer: Self-pay

## 2017-10-15 ENCOUNTER — Emergency Department (HOSPITAL_BASED_OUTPATIENT_CLINIC_OR_DEPARTMENT_OTHER)
Admission: EM | Admit: 2017-10-15 | Discharge: 2017-10-15 | Disposition: A | Discharge status: Home / Self Care | Payer: 59 | Attending: Emergency Medicine | Admitting: Emergency Medicine

## 2017-10-15 ENCOUNTER — Encounter (HOSPITAL_BASED_OUTPATIENT_CLINIC_OR_DEPARTMENT_OTHER): Payer: Self-pay | Admitting: *Deleted

## 2017-10-15 DIAGNOSIS — Z79899 Other long term (current) drug therapy: Secondary | ICD-10-CM | POA: Insufficient documentation

## 2017-10-15 DIAGNOSIS — N390 Urinary tract infection, site not specified: Secondary | ICD-10-CM | POA: Insufficient documentation

## 2017-10-15 DIAGNOSIS — N760 Acute vaginitis: Secondary | ICD-10-CM | POA: Insufficient documentation

## 2017-10-15 DIAGNOSIS — J45909 Unspecified asthma, uncomplicated: Secondary | ICD-10-CM | POA: Insufficient documentation

## 2017-10-15 DIAGNOSIS — B9689 Other specified bacterial agents as the cause of diseases classified elsewhere: Secondary | ICD-10-CM

## 2017-10-15 LAB — URINALYSIS, ROUTINE W REFLEX MICROSCOPIC
Bilirubin Urine: NEGATIVE
Glucose, UA: NEGATIVE mg/dL
KETONES UR: NEGATIVE mg/dL
Nitrite: NEGATIVE
Protein, ur: NEGATIVE mg/dL
SPECIFIC GRAVITY, URINE: 1.02 (ref 1.005–1.030)
pH: 7.5 (ref 5.0–8.0)

## 2017-10-15 LAB — WET PREP, GENITAL
SPERM: NONE SEEN
TRICH WET PREP: NONE SEEN
Yeast Wet Prep HPF POC: NONE SEEN

## 2017-10-15 LAB — PREGNANCY, URINE: Preg Test, Ur: NEGATIVE

## 2017-10-15 LAB — URINALYSIS, MICROSCOPIC (REFLEX): WBC, UA: >50 WBC/hpf (ref 0–5)

## 2017-10-15 MED ORDER — CEPHALEXIN 500 MG PO CAPS: 500.0000 mg | ORAL_CAPSULE | Freq: Two times a day (BID) | ORAL | 0 refills | Status: DC

## 2017-10-15 MED ORDER — METRONIDAZOLE 500 MG PO TABS: 500.0000 mg | ORAL_TABLET | Freq: Two times a day (BID) | ORAL | 0 refills | Status: DC

## 2017-10-15 MED ORDER — CEPHALEXIN 250 MG PO CAPS
500.0000 mg | ORAL_CAPSULE | Freq: Once | ORAL | Status: AC
  Administered 2017-10-15: 500 mg via ORAL
  Filled 2017-10-15: qty 2

## 2017-10-15 NOTE — ED Triage Notes (Signed)
Back pain, dysuria and cloudy urine x 3 days.

## 2017-10-15 NOTE — Discharge Instructions (Addendum)
Take Flagyl as prescribed until all gone for bacterial vaginosis.  Do not drink alcohol while taking this medicine.  Take Keflex as prescribed until all gone for urinary tract infection.  Follow-up with your doctor as needed.

## 2017-10-15 NOTE — ED Provider Notes (Signed)
[facility] EMERGENCY DEPARTMENT Provider Note   CSN: [id number] Arrival date & time: 10/15/17  2131     History   Chief Complaint Chief Complaint  Patient presents with  . Dysuria    HPI Teresa Durham is a 22 y.o. female.  HPI  Teresa Durham is a 22 y.o. female presents to emergency department complaining of cloudy urine, dysuria, urinary frequency and urgency.  Patient states that she has had symptoms for approximately 2 days.  She states this morning she developed some lower back pain.  Denies any flank pain.  No abdominal pain.  No vaginal discharge or bleeding.  Does not think she is pregnant.  Denies any fever.  No nausea or vomiting.  Did not take any medications prior to coming in.  She states she was diagnosed UTI in the past and states this feels the same   Past Medical History:  Diagnosis Date  . Asthma   . PCOS (polycystic ovarian syndrome)     Patient Active Problem List   Diagnosis Date Noted  . Murmur 04/04/2013  . Chest pressure 04/04/2013  . Dyspnea 04/04/2013  . Asthma 04/04/2013  . Costochondritis 04/04/2013    Past Surgical History:  Procedure Laterality Date  . TONSILLECTOMY       OB History    Gravida  0   Para  0   Term  0   Preterm  0   AB  0   Living  0     SAB  0   TAB  0   Ectopic  0   Multiple  0   Live Births  0            Home Medications    Prior to Admission medications   Medication Sig Start Date End Date Taking? Authorizing Provider  albuterol (PROVENTIL HFA;VENTOLIN HFA) 108 (90 BASE) MCG/ACT inhaler Inhale into the lungs every 6 (six) hours as needed for wheezing or shortness of breath.    [provider]  cetirizine (ZYRTEC) 10 MG tablet Take 10 mg by mouth daily.    [provider]  etonogestrel (NEXPLANON) 68 MG IMPL implant Inject 1 each into the skin once.    [provider]  HYDROcodone-acetaminophen (NORCO/VICODIN) 5-325 MG per tablet Take 1 tablet by  mouth every 6 (six) hours as needed for moderate pain. 09/29/13   Cater, Luis Abed, MD  ibuprofen (ADVIL,MOTRIN) 800 MG tablet Take 1 tablet (800 mg total) by mouth 2 (two) times daily as needed. 04/04/13   Chrystie Nose, MD    Family History No family history on file.  Social History Social History   Tobacco Use  . Smoking status: Never Smoker  . Smokeless tobacco: Never Used  Substance Use Topics  . Alcohol use: No  . Drug use: No     Allergies   Patient has no known allergies.   Review of Systems Review of Systems  Constitutional: Negative for chills and fever.  Gastrointestinal: Negative for abdominal pain, nausea and vomiting.  Genitourinary: Positive for dysuria and urgency. Negative for difficulty urinating, flank pain, frequency, pelvic pain, vaginal bleeding, vaginal discharge and vaginal pain.  All other systems reviewed and are negative.    Physical Exam Updated Vital Signs BP 112/78 (BP Location: Left Arm)   Pulse 78   Temp 98.6 F (37 C) (Oral)   Resp 16 Comment: Simultaneous filing. User may not have seen previous data.  Ht  (1.6 m)  Wt 88 kg (194 lb)   LMP 10/08/2017   SpO2 100%   BMI 34.37 kg/m   Physical Exam  Constitutional: She appears well-developed and well-nourished. No distress.  HENT:  Head: Normocephalic.  Eyes: Conjunctivae are normal.  Neck: Neck supple.  Cardiovascular: Normal rate, regular rhythm and normal heart sounds.  Pulmonary/Chest: Effort normal and breath sounds normal. No respiratory distress. She has no wheezes. She has no rales.  Abdominal: Soft. Bowel sounds are normal. She exhibits no distension. There is no tenderness. There is no rebound.  No CVA tenderness bilaterally  Genitourinary:  Genitourinary Comments: Normal external genitalia. Normal vaginal canal. Small thin Foulks discharge. Cervix is normal, closed. No CMT. No uterine or adnexal tenderness. No masses palpated.    Musculoskeletal: She exhibits no  edema.  Neurological: She is alert.  Skin: Skin is warm and dry.  Psychiatric: She has a normal mood and affect. Her behavior is normal.  Nursing note and vitals reviewed.    ED Treatments / Results  Labs (all labs ordered are listed, but only abnormal results are displayed) Labs Reviewed  WET PREP, GENITAL - Abnormal; Notable for the following components:      Result Value   Clue Cells Wet Prep HPF POC PRESENT (*)    WBC, Wet Prep HPF POC MODERATE (*)    All other components within normal limits  URINALYSIS, ROUTINE W REFLEX MICROSCOPIC - Abnormal; Notable for the following components:   APPearance CLOUDY (*)    Hgb urine dipstick TRACE (*)    Leukocytes, UA MODERATE (*)    All other components within normal limits  URINALYSIS, MICROSCOPIC (REFLEX) - Abnormal; Notable for the following components:   Bacteria, UA MANY (*)    All other components within normal limits  URINE CULTURE  PREGNANCY, URINE  GC/CHLAMYDIA PROBE AMP ([place]) NOT AT [facility]    EKG None  Radiology No results found.  Procedures Procedures (including critical care time)  Medications Ordered in ED Medications  cephALEXin (KEFLEX) capsule 500 mg (500 mg Oral Given 10/15/17 2256)     Initial Impression / Assessment and Plan / ED Course  I have reviewed the triage vital signs and the nursing notes.  Pertinent labs & imaging results that were available during my care of the patient were reviewed by me and considered in my medical decision making (see chart for details).     Patient in emergency department with dysuria, frequency, cloudy urine.  Initially told me she had no vaginal complaints, however later she called me into the room and asked me if I could perform a pelvic exam.  Pelvic exam is unremarkable other than [place] thin discharge.  Clue cells present.  Will cover for bacterial vaginosis.  Gonorrhea and Chlamydia cultures were sent.  Urine analysis positive for infection.  With many bacteria  and greater than 50 WBCs.  She has no CVA tenderness.  No fever.  No nausea or vomiting.  No abdominal tenderness.  Doubt pyelonephritis.  Will start on Keflex.  We will have her follow-up with family doctor.  Cultures of urine sent.   Vitals:   10/15/17 2135 10/15/17 2137  BP:  112/78  Pulse:  78  Resp:  16  Temp:  98.6 F (37 C)  TempSrc:  Oral  SpO2:  100%  Weight: 88 kg (194 lb)   Height:  (1.6 m)      Final Clinical Impressions(s) / ED Diagnoses   Final diagnoses:  Lower urinary tract  infectious disease  BV (bacterial vaginosis)    ED Discharge Orders        Ordered    cephALEXin (KEFLEX) 500 MG capsule  2 times daily     10/15/17 2315    metroNIDAZOLE (FLAGYL) 500 MG tablet  2 times daily     10/15/17 2315       Jaynie Crumble, PA-C 10/15/17 2317    Maia Plan, MD 10/16/17 1105

## 2017-10-18 LAB — GC/CHLAMYDIA PROBE AMP (~~LOC~~) NOT AT ARMC
Chlamydia: NEGATIVE
Neisseria Gonorrhea: NEGATIVE

## 2017-10-18 LAB — URINE CULTURE

## 2017-10-19 ENCOUNTER — Telehealth: Payer: Self-pay | Admitting: *Deleted

## 2017-10-19 NOTE — Telephone Encounter (Signed)
Post ED Visit - Positive Culture Follow-up  Culture report reviewed by antimicrobial stewardship pharmacist:  []  Enzo BiNathan Batchelder, Pharm.D. []  Celedonio MiyamotoJeremy Frens, Pharm.D., BCPS AQ-ID []  Garvin FilaMike Maccia, Pharm.D., BCPS []  Georgina PillionElizabeth Martin, Pharm.D., BCPS []  North PoleMinh Pham, [place].D., BCPS, AAHIVP []  Estella HuskMichelle Turner, Pharm.D., BCPS, AAHIVP []  Lysle Pearlachel Rumbarger, PharmD, BCPS []  Sherlynn CarbonAustin Lucas, PharmD []  Pollyann SamplesAndy Johnston, PharmD, BCPS Ladell PierBrooke Baggett, PharmD  Positive urine culture Treated with Cephalexin, organism sensitive to the same and no further patient follow-up is required at this time.  Virl AxeRobertson, Jonnatan [facility] 10/19/2017, 10:18 AM

## 2018-01-17 ENCOUNTER — Emergency Department (HOSPITAL_BASED_OUTPATIENT_CLINIC_OR_DEPARTMENT_OTHER)
Admission: EM | Admit: 2018-01-17 | Discharge: 2018-01-17 | Disposition: A | Discharge status: Home / Self Care | Payer: BLUE CROSS/BLUE SHIELD | Attending: Emergency Medicine | Admitting: Emergency Medicine

## 2018-01-17 ENCOUNTER — Other Ambulatory Visit: Payer: Self-pay

## 2018-01-17 ENCOUNTER — Encounter (HOSPITAL_BASED_OUTPATIENT_CLINIC_OR_DEPARTMENT_OTHER): Payer: Self-pay | Admitting: *Deleted

## 2018-01-17 ENCOUNTER — Emergency Department (HOSPITAL_BASED_OUTPATIENT_CLINIC_OR_DEPARTMENT_OTHER): Payer: BLUE CROSS/BLUE SHIELD

## 2018-01-17 DIAGNOSIS — R52 Pain, unspecified: Secondary | ICD-10-CM

## 2018-01-17 DIAGNOSIS — Z79899 Other long term (current) drug therapy: Secondary | ICD-10-CM | POA: Diagnosis not present

## 2018-01-17 DIAGNOSIS — N730 Acute parametritis and pelvic cellulitis: Secondary | ICD-10-CM

## 2018-01-17 DIAGNOSIS — N739 Female pelvic inflammatory disease, unspecified: Secondary | ICD-10-CM | POA: Diagnosis not present

## 2018-01-17 DIAGNOSIS — R1030 Lower abdominal pain, unspecified: Secondary | ICD-10-CM | POA: Diagnosis present

## 2018-01-17 LAB — URINALYSIS, ROUTINE W REFLEX MICROSCOPIC
BILIRUBIN URINE: NEGATIVE
Glucose, UA: NEGATIVE mg/dL
Hgb urine dipstick: NEGATIVE
Ketones, ur: NEGATIVE mg/dL
NITRITE: NEGATIVE
PH: 7 (ref 5.0–8.0)
Protein, ur: NEGATIVE mg/dL
SPECIFIC GRAVITY, URINE: 1.01 (ref 1.005–1.030)

## 2018-01-17 LAB — URINALYSIS, MICROSCOPIC (REFLEX): RBC / HPF: NONE SEEN RBC/hpf (ref 0–5)

## 2018-01-17 LAB — WET PREP, GENITAL
SPERM: NONE SEEN
Trich, Wet Prep: NONE SEEN
YEAST WET PREP: NONE SEEN

## 2018-01-17 LAB — PREGNANCY, URINE: PREG TEST UR: NEGATIVE

## 2018-01-17 MED ORDER — ONDANSETRON 4 MG PO TBDP
4.0000 mg | ORAL_TABLET | Freq: Once | ORAL | Status: AC
Start: 2018-01-17 — End: 2018-01-17
  Administered 2018-01-17: 4 mg via ORAL
  Filled 2018-01-17: qty 1

## 2018-01-17 MED ORDER — HYDROCODONE-ACETAMINOPHEN 5-325 MG PO TABS: 1.0000 | ORAL_TABLET | Freq: Four times a day (QID) | ORAL | 0 refills | Status: DC | PRN

## 2018-01-17 MED ORDER — LIDOCAINE HCL (PF) 1 % IJ SOLN
INTRAMUSCULAR | Status: AC
  Administered 2018-01-17: 5 mL
  Filled 2018-01-17: qty 5

## 2018-01-17 MED ORDER — DOXYCYCLINE HYCLATE 100 MG PO CAPS: 100.0000 mg | ORAL_CAPSULE | Freq: Two times a day (BID) | ORAL | 0 refills | Status: AC

## 2018-01-17 MED ORDER — AZITHROMYCIN 250 MG PO TABS
1000.0000 mg | ORAL_TABLET | Freq: Once | ORAL | Status: AC
  Administered 2018-01-17: 1000 mg via ORAL
  Filled 2018-01-17: qty 4

## 2018-01-17 MED ORDER — KETOROLAC TROMETHAMINE 15 MG/ML IJ SOLN
15.0000 mg | Freq: Once | INTRAMUSCULAR | Status: AC
  Administered 2018-01-17: 15 mg via INTRAMUSCULAR
  Filled 2018-01-17: qty 1

## 2018-01-17 MED ORDER — CEFTRIAXONE SODIUM 250 MG IJ SOLR
250.0000 mg | Freq: Once | INTRAMUSCULAR | Status: AC
  Administered 2018-01-17: 250 mg via INTRAMUSCULAR
  Filled 2018-01-17: qty 250

## 2018-01-17 NOTE — Discharge Instructions (Signed)
Take doxycycline as prescribed.  Take the entire course, even if your symptoms improve. Use anti-inflammatory such as ibuprofen as needed for pain.  You may use a heating pad for further pain control. Take norco as needed for severe or breakthrough pain.  Have caution, as this may make you tired or groggy.  Do not drive or operate heavy machinery. Follow up with your OB/GYN as needed if pain continues. Return to the emergency room with any new, worsening, or concerning symptoms.

## 2018-01-17 NOTE — ED Triage Notes (Signed)
PT reports dysuria, back pain x 1 week.

## 2018-01-18 LAB — GC/CHLAMYDIA PROBE AMP (~~LOC~~) NOT AT ARMC
Chlamydia: NEGATIVE
NEISSERIA GONORRHEA: NEGATIVE

## 2018-01-18 NOTE — ED Provider Notes (Signed)
[facility] EMERGENCY DEPARTMENT Provider Note   CSN: [id number] Arrival date & time: 01/17/18  1630     History   Chief Complaint Chief Complaint  Patient presents with  . Abdominal Pain    HPI Teresa Durham is a 22 y.o. female presenting for evaluation of lower abdominal pain.  Patient states she developed back tightness 2 weeks ago.  She has been using heating pads improvement of pain.  Yesterday, she started to develop acute severe lower abdominal pain.  It is suprapubic and bilateral.  Symptoms are worse with movement, straining, and when she urinates.  She denies dysuria, hematuria, urinary frequency, but states when she urinates she can feel it in her suprapubic abdomen.  She denies fevers, chills, chest pain, shortness of breath, nausea, vomiting.  She has a history of PCOS, not seen her OB/GYN in over a year.  She is sexually active with 2 female partners.  She does not always use condoms.  She denies vaginal discharge.  She has no other medical problems, takes no medications daily.  Her last period was a month and a half ago, but states this is normal for her to be irregular.  HPI  Past Medical History:  Diagnosis Date  . Asthma   . PCOS (polycystic ovarian syndrome)     Patient Active Problem List   Diagnosis Date Noted  . Murmur 04/04/2013  . Chest pressure 04/04/2013  . Dyspnea 04/04/2013  . Asthma 04/04/2013  . Costochondritis 04/04/2013    Past Surgical History:  Procedure Laterality Date  . TONSILLECTOMY       OB History    Gravida  0   Para  0   Term  0   Preterm  0   AB  0   Living  0     SAB  0   TAB  0   Ectopic  0   Multiple  0   Live Births  0            Home Medications    Prior to Admission medications   Medication Sig Start Date End Date Taking? Authorizing Provider  albuterol (PROVENTIL HFA;VENTOLIN HFA) 108 (90 BASE) MCG/ACT inhaler Inhale into the lungs every 6 (six) hours as needed for wheezing or  shortness of breath.    [provider]  cephALEXin (KEFLEX) 500 MG capsule Take 1 capsule (500 mg total) by mouth 2 (two) times daily. 10/15/17   Kirichenko, Tatyana, PA-C  cetirizine (ZYRTEC) 10 MG tablet Take 10 mg by mouth daily.    [provider]  doxycycline (VIBRAMYCIN) 100 MG capsule Take 1 capsule (100 mg total) by mouth 2 (two) times daily for 14 days. 01/17/18 01/31/18  Darcelle Herrada, PA-C  etonogestrel (NEXPLANON) 68 MG IMPL implant Inject 1 each into the skin once.    [provider]  HYDROcodone-acetaminophen (NORCO/VICODIN) 5-325 MG tablet Take 1 tablet by mouth every 6 (six) hours as needed for severe pain. 01/17/18   Kerrigan Glendening, PA-C  ibuprofen (ADVIL,MOTRIN) 800 MG tablet Take 1 tablet (800 mg total) by mouth 2 (two) times daily as needed. 04/04/13   Hilty, Lisette Abu, MD  metroNIDAZOLE (FLAGYL) 500 MG tablet Take 1 tablet (500 mg total) by mouth 2 (two) times daily. 10/15/17   Jaynie Crumble, PA-C    Family History History reviewed. No pertinent family history.  Social History Social History   Tobacco Use  . Smoking status: Never Smoker  . Smokeless tobacco: Never Used  Substance Use Topics  . Alcohol use: No  . Drug use: No     Allergies   Patient has no known allergies.   Review of Systems Review of Systems  Genitourinary: Positive for pelvic pain.  Musculoskeletal: Positive for back pain.  All other systems reviewed and are negative.    Physical Exam Updated Vital Signs BP 107/62 (BP Location: Right Arm)   Pulse 72   Temp 98.4 F (36.9 C) (Oral)   Resp 12   Ht 5\' 3"  (1.6 m)   Wt 90.7 kg   LMP 11/16/2017   SpO2 100%   BMI 35.43 kg/m   Physical Exam  Constitutional: She is oriented to person, place, and time. She appears well-developed and well-nourished. No distress.  Sitting comfortably in the bed in no acute distress  HENT:  Head: Normocephalic and atraumatic.  Eyes: Pupils are equal, round, and  reactive to light. Conjunctivae and EOM are normal.  Neck: Normal range of motion. Neck supple.  Cardiovascular: Normal rate, regular rhythm and intact distal pulses.  Pulmonary/Chest: Effort normal and breath sounds normal. No respiratory distress. She has no wheezes.  Abdominal: Soft. She exhibits no distension and no mass. There is tenderness. There is no rebound and no guarding.  Tenderness palpation of suprapubic abdomen bilateral lower abdomen.  No tenderness palpation of central or upper abdomen.  No rigidity, guarding or distention.  Negative rebound, no signs of peritonitis.  Genitourinary: Vagina normal. Pelvic exam was performed with patient supine. There is no rash, tenderness or lesion on the right labia. There is no rash, tenderness or lesion on the left labia. Cervix exhibits motion tenderness and discharge. Cervix exhibits no friability. Right adnexum displays no mass, no tenderness and no fullness. Left adnexum displays no mass, no tenderness and no fullness.  Genitourinary Comments: Chaperone present.  Thick Carline discharge noted on exam.  Cervical motion tenderness.  No obvious adnexal tenderness.  Musculoskeletal: Normal range of motion.  Tenderness palpation of low back musculature bilaterally.  No tenderness palpation over midline spine.  No CVA tenderness.  Neurological: She is alert and oriented to person, place, and time.  Skin: Skin is warm and dry.  Psychiatric: She has a normal mood and affect.  Nursing note and vitals reviewed.    ED Treatments / Results  Labs (all labs ordered are listed, but only abnormal results are displayed) Labs Reviewed  WET PREP, GENITAL - Abnormal; Notable for the following components:      Result Value   Clue Cells Wet Prep HPF POC PRESENT (*)    WBC, Wet Prep HPF POC MANY (*)    All other components within normal limits  URINALYSIS, ROUTINE W REFLEX MICROSCOPIC - Abnormal; Notable for the following components:   APPearance CLOUDY (*)     Leukocytes, UA TRACE (*)    All other components within normal limits  URINALYSIS, MICROSCOPIC (REFLEX) - Abnormal; Notable for the following components:   Bacteria, UA MANY (*)    All other components within normal limits  URINE CULTURE  PREGNANCY, URINE  GC/CHLAMYDIA PROBE AMP ([place]) NOT AT [facility]    EKG None  Radiology US Transvaginal Non-ob  Result Date: 01/17/2018 CLINICAL DATA:  Pelvic pain EXAM: TRANSABDOMINAL AND TRANSVAGINAL ULTRASOUND OF PELVIS DOPPLER ULTRASOUND OF OVARIES TECHNIQUE: Both transabdominal and transvaginal ultrasound examinations of the pelvis were performed. Transabdominal technique was performed for global imaging of the pelvis including uterus, ovaries, adnexal regions, and pelvic cul-de-sac. It was necessary to proceed with  endovaginal exam following the transabdominal exam to visualize the endometrium and ovaries. Color and duplex Doppler ultrasound was utilized to evaluate blood flow to the ovaries. COMPARISON:  None. FINDINGS: Uterus Measurements: 7.8 x 4.0 x 2.9 cm. No fibroids or other mass visualized. Endometrium Thickness: 3.7 mm.  No focal abnormality visualized. Right ovary Measurements: 4.6 x 5.1 x 4.8 cm.  Cyst measuring 3.8 x 4.4 x 4.4 cm Left ovary Measurements: 3.3 x 3 x 1.9 cm.  Normal appearance/no adnexal mass. Pulsed Doppler evaluation of both ovaries demonstrates normal low-resistance arterial and venous waveforms. Other findings Small free fluid in the pelvis. IMPRESSION: 1. Negative for ovarian torsion 2. 4.4 cm right ovarian cyst. This is almost certainly benign, and no specific imaging follow up is recommended according to the Society of Radiologists in Ultrasound2010 Consensus Conference Statement (D Lenis Noon et al. Management of Asymptomatic Ovarian and Other Adnexal Cysts Imaged at [place]: Society of Radiologists in Ultrasound Consensus Conference Statement 2010. Radiology 256 (Sept 2010): 943-954.). 3. Small amount of free fluid in the pelvis  Electronically Signed   By: Jasmine Pang M.D.   On: 01/17/2018 20:13   US Pelvis Complete  Result Date: 01/17/2018 CLINICAL DATA:  Pelvic pain EXAM: TRANSABDOMINAL AND TRANSVAGINAL ULTRASOUND OF PELVIS DOPPLER ULTRASOUND OF OVARIES TECHNIQUE: Both transabdominal and transvaginal ultrasound examinations of the pelvis were performed. Transabdominal technique was performed for global imaging of the pelvis including uterus, ovaries, adnexal regions, and pelvic cul-de-sac. It was necessary to proceed with endovaginal exam following the transabdominal exam to visualize the endometrium and ovaries. Color and duplex Doppler ultrasound was utilized to evaluate blood flow to the ovaries. COMPARISON:  None. FINDINGS: Uterus Measurements: 7.8 x 4.0 x 2.9 cm. No fibroids or other mass visualized. Endometrium Thickness: 3.7 mm.  No focal abnormality visualized. Right ovary Measurements: 4.6 x 5.1 x 4.8 cm.  Cyst measuring 3.8 x 4.4 x 4.4 cm Left ovary Measurements: 3.3 x 3 x 1.9 cm.  Normal appearance/no adnexal mass. Pulsed Doppler evaluation of both ovaries demonstrates normal low-resistance arterial and venous waveforms. Other findings Small free fluid in the pelvis. IMPRESSION: 1. Negative for ovarian torsion 2. 4.4 cm right ovarian cyst. This is almost certainly benign, and no specific imaging follow up is recommended according to the Society of Radiologists in Ultrasound2010 Consensus Conference Statement (D Lenis Noon et al. Management of Asymptomatic Ovarian and Other Adnexal Cysts Imaged at [place]: Society of Radiologists in Ultrasound Consensus Conference Statement 2010. Radiology 256 (Sept 2010): 943-954.). 3. Small amount of free fluid in the pelvis Electronically Signed   By: Jasmine Pang M.D.   On: 01/17/2018 20:13   US Pelvic Doppler (torsion R/o Or Mass Arterial Flow)  Result Date: 01/17/2018 CLINICAL DATA:  Pelvic pain EXAM: TRANSABDOMINAL AND TRANSVAGINAL ULTRASOUND OF PELVIS DOPPLER ULTRASOUND OF OVARIES  TECHNIQUE: Both transabdominal and transvaginal ultrasound examinations of the pelvis were performed. Transabdominal technique was performed for global imaging of the pelvis including uterus, ovaries, adnexal regions, and pelvic cul-de-sac. It was necessary to proceed with endovaginal exam following the transabdominal exam to visualize the endometrium and ovaries. Color and duplex Doppler ultrasound was utilized to evaluate blood flow to the ovaries. COMPARISON:  None. FINDINGS: Uterus Measurements: 7.8 x 4.0 x 2.9 cm. No fibroids or other mass visualized. Endometrium Thickness: 3.7 mm.  No focal abnormality visualized. Right ovary Measurements: 4.6 x 5.1 x 4.8 cm.  Cyst measuring 3.8 x 4.4 x 4.4 cm Left ovary Measurements: 3.3 x 3 x 1.9 cm.  Normal  appearance/no adnexal mass. Pulsed Doppler evaluation of both ovaries demonstrates normal low-resistance arterial and venous waveforms. Other findings Small free fluid in the pelvis. IMPRESSION: 1. Negative for ovarian torsion 2. 4.4 cm right ovarian cyst. This is almost certainly benign, and no specific imaging follow up is recommended according to the Society of Radiologists in Ultrasound2010 Consensus Conference Statement (D Lenis Noon et al. Management of Asymptomatic Ovarian and Other Adnexal Cysts Imaged at [place]: Society of Radiologists in Ultrasound Consensus Conference Statement 2010. Radiology 256 (Sept 2010): 943-954.). 3. Small amount of free fluid in the pelvis Electronically Signed   By: Jasmine Pang M.D.   On: 01/17/2018 20:13    Procedures Procedures (including critical care time)  Medications Ordered in ED Medications  cefTRIAXone (ROCEPHIN) injection 250 mg (250 mg Intramuscular Given 01/17/18 2134)  azithromycin (ZITHROMAX) tablet 1,000 mg (1,000 mg Oral Given 01/17/18 2134)  ondansetron (ZOFRAN-ODT) disintegrating tablet 4 mg (4 mg Oral Given 01/17/18 2134)  ketorolac (TORADOL) 15 MG/ML injection 15 mg (15 mg Intramuscular Given 01/17/18 2134)    lidocaine (PF) (XYLOCAINE) 1 % injection (5 mLs  Given 01/17/18 2134)     Initial Impression / Assessment and Plan / ED Course  I have reviewed the triage vital signs and the nursing notes.  Pertinent labs & imaging results that were available during my care of the patient were reviewed by me and considered in my medical decision making (see chart for details).     Pt  presenting for evaluation of low abdominal pain.  Initial examination, she is afebrile not tachycardic.  Appears nontoxic.  However, on abdominal exam, has significant lower abdominal tenderness overlying her suprapubic abdomen.  Concern for possible GU pathology.  Less likely intra-abdominal pathology.  On pelvic exam, discharge noted with cervical motion tenderness.  Concern for possible PID.  Will obtain ultrasound to rule out TOA or torsion.  Ultrasound negative for TOA or torsion.  Shows cyst of the right adnexa, patient states her PCOS is on her right side.  Discussed treatment with antibiotics and pain control.  Be checked, patient without recent narcotic prescription.  Will give short course of Norco needed for severe pain.  Discussed use of anti-inflammatories and heating pad.  Discussed follow-up with OB/GYN if symptoms are not improving.  Urine without obvious infection, culture sent.  At this time, patient appears safe for discharge.  Return precautions given.  Patient states she understands agrees plan.   Final Clinical Impressions(s) / ED Diagnoses   Final diagnoses:  PID (acute pelvic inflammatory disease)    ED Discharge Orders         Ordered    doxycycline (VIBRAMYCIN) 100 MG capsule  2 times daily     01/17/18 2128    HYDROcodone-acetaminophen (NORCO/VICODIN) 5-325 MG tablet  Every 6 hours PRN     01/17/18 2128           Selisa Tensley, PA-C 01/18/18 0040    Gwyneth Sprout, MD 01/18/18 1512

## 2018-01-20 LAB — URINE CULTURE

## 2018-01-21 ENCOUNTER — Telehealth: Payer: Self-pay

## 2018-01-21 NOTE — Telephone Encounter (Signed)
Post ED Visit - Positive Culture Follow-up  Culture report reviewed by antimicrobial stewardship pharmacist:  []  Enzo Bi, Pharm.D. []  Celedonio Miyamoto, Pharm.D., BCPS AQ-ID []  Garvin Fila, Pharm.D., BCPS []  Georgina Pillion, Pharm.D., BCPS []  [place], [place].D., BCPS, AAHIVP []  Estella Husk, Pharm.D., BCPS, AAHIVP []  Lysle Pearl, PharmD, BCPS []  Phillips Climes, PharmD, BCPS []  Agapito Games, PharmD, BCPS []  Verlan Friends, PharmD Gwynneth Albright Pharm D Positive urine culture Treated with Doxycycline, organism sensitive to the same and no further patient follow-up is required at this time.  Jerry Caras 01/21/2018, 11:49 AM

## 2018-04-01 DIAGNOSIS — J019 Acute sinusitis, unspecified: Secondary | ICD-10-CM | POA: Diagnosis not present

## 2018-05-31 DIAGNOSIS — Z01419 Encounter for gynecological examination (general) (routine) without abnormal findings: Secondary | ICD-10-CM | POA: Diagnosis not present

## 2018-05-31 DIAGNOSIS — H52223 Regular astigmatism, bilateral: Secondary | ICD-10-CM | POA: Diagnosis not present

## 2018-05-31 DIAGNOSIS — Z118 Encounter for screening for other infectious and parasitic diseases: Secondary | ICD-10-CM | POA: Diagnosis not present

## 2018-05-31 DIAGNOSIS — Z6837 Body mass index (BMI) 37.0-37.9, adult: Secondary | ICD-10-CM | POA: Diagnosis not present

## 2018-06-12 ENCOUNTER — Encounter (HOSPITAL_COMMUNITY): Payer: Self-pay | Admitting: Emergency Medicine

## 2018-06-12 ENCOUNTER — Ambulatory Visit (HOSPITAL_COMMUNITY)
Admission: EM | Admit: 2018-06-12 | Discharge: 2018-06-12 | Disposition: A | Discharge status: Home / Self Care | Payer: BLUE CROSS/BLUE SHIELD | Attending: Urgent Care | Admitting: Urgent Care

## 2018-06-12 ENCOUNTER — Other Ambulatory Visit: Payer: Self-pay

## 2018-06-12 DIAGNOSIS — R69 Illness, unspecified: Secondary | ICD-10-CM | POA: Insufficient documentation

## 2018-06-12 DIAGNOSIS — R519 Headache, unspecified: Secondary | ICD-10-CM

## 2018-06-12 DIAGNOSIS — J111 Influenza due to unidentified influenza virus with other respiratory manifestations: Secondary | ICD-10-CM

## 2018-06-12 DIAGNOSIS — R51 Headache: Secondary | ICD-10-CM | POA: Insufficient documentation

## 2018-06-12 DIAGNOSIS — R52 Pain, unspecified: Secondary | ICD-10-CM

## 2018-06-12 MED ORDER — OSELTAMIVIR PHOSPHATE 75 MG PO CAPS: 75.0000 mg | ORAL_CAPSULE | Freq: Two times a day (BID) | ORAL | 0 refills | Status: DC

## 2018-06-12 NOTE — Discharge Instructions (Signed)
-   You may take ibuprofen 400mg -600mg  with food for your throat every 8 hours. Tylenol can also be used for sore throat. - You may also use 2 tablespoons of warm honey in warmed water every 4-6 hours to coat and soothe your throat. - Drink plenty of water, at least 64 ounces daily and rest to make sure your body has a chance to get better. - You can use Zyrtec again to address any congestion, post-nasal drainage.   Physical Exam

## 2018-06-12 NOTE — ED Provider Notes (Signed)
MRN: [id number] DOB: 13-Jan-1996  Subjective:   Teresa Durham is a 22 y.o. female presenting for 1 day history of moderate-severe body aches, 3 day history of parietal headache that radiates to her neck and upper back. Works at a call center. Denies smoking cigarettes. Has allergies, takes Zyrtec prn. Has tried ibuprofen. Has a history of childhood asthma. Nexplanon is expired. She is on ocp, [place].   No current facility-administered medications for this encounter.   Current Outpatient Medications:  .  ibuprofen (ADVIL,MOTRIN) 800 MG tablet, Take 1 tablet (800 mg total) by mouth 2 (two) times daily as needed., Disp: 60 tablet, Rfl: 3 .  etonogestrel (NEXPLANON) 68 MG IMPL implant, Inject 1 each into the skin once., Disp: , Rfl:    No Known Allergies  Past Medical History:  Diagnosis Date  . Asthma   . PCOS (polycystic ovarian syndrome)      Past Surgical History:  Procedure Laterality Date  . TONSILLECTOMY      Review of Systems  Constitutional: Positive for chills, fever and malaise/fatigue.  HENT: Positive for sore throat (mild, starting today). Negative for congestion, ear pain and sinus pain.   Eyes: Negative for blurred vision and double vision.  Respiratory: Positive for cough (mild, occasional). Negative for shortness of breath and wheezing.   Cardiovascular: Negative for chest pain.  Gastrointestinal: Positive for nausea (decreased appetite). Negative for abdominal pain, blood in stool, constipation, diarrhea and vomiting.  Genitourinary: Negative for dysuria and hematuria.  Musculoskeletal: Positive for myalgias.  Skin: Negative for rash.  Neurological: Positive for headaches.  Psychiatric/Behavioral: Negative for depression and suicidal ideas. The patient is not nervous/anxious.    Objective:   Vitals: BP (!) 117/56 (BP Location: Right Arm)   Pulse 87   Temp 98.5 F (36.9 C) (Oral)   Resp 16   SpO2 99%   Physical Exam Constitutional:      General: She is  not in acute distress.    Appearance: Normal appearance. She is well-developed. She is not ill-appearing, toxic-appearing or diaphoretic.  HENT:     Head: Normocephalic and atraumatic.     Right Ear: Tympanic membrane and ear canal normal. No drainage or tenderness. No middle ear effusion. Tympanic membrane is not erythematous.     Left Ear: Tympanic membrane and ear canal normal. No drainage or tenderness.  No middle ear effusion. Tympanic membrane is not erythematous.     Nose: Nose normal. No congestion or rhinorrhea.     Mouth/Throat:     Mouth: Mucous membranes are moist. No oral lesions.     Pharynx: Oropharynx is clear. No pharyngeal swelling, oropharyngeal exudate, posterior oropharyngeal erythema or uvula swelling.     Tonsils: No tonsillar exudate or tonsillar abscesses.  Eyes:     General: No scleral icterus.       Right eye: No discharge.        Left eye: No discharge.     Extraocular Movements: Extraocular movements intact.     Right eye: Normal extraocular motion.     Left eye: Normal extraocular motion.     Conjunctiva/sclera: Conjunctivae normal.     Pupils: Pupils are equal, round, and reactive to light.  Neck:     Musculoskeletal: Normal range of motion and neck supple.  Cardiovascular:     Rate and Rhythm: Normal rate and regular rhythm.     Pulses: Normal pulses.     Heart sounds: Normal heart sounds. No murmur. No friction rub. No gallop.  Pulmonary:     Effort: Pulmonary effort is normal. No respiratory distress.     Breath sounds: Normal breath sounds. No stridor. No wheezing, rhonchi or rales.  Abdominal:     General: Bowel sounds are normal. There is no distension.     Palpations: Abdomen is soft.     Tenderness: There is no abdominal tenderness. There is no right CVA tenderness, left CVA tenderness, guarding or rebound.  Lymphadenopathy:     Cervical: No cervical adenopathy.  Skin:    General: Skin is warm and dry.     Findings: No rash.  Neurological:      General: No focal deficit present.     Mental Status: She is alert and oriented to person, place, and time.  Psychiatric:        Mood and Affect: Mood normal.        Behavior: Behavior normal.        Thought Content: Thought content normal.     Assessment and Plan :    Influenza-like illness  Acute nonintractable headache, unspecified headache type  Body aches  Will manage for influenza like illness. Advised supportive care, offered symptomatic relief. Return-to-clinic precautions discussed, patient verbalized understanding.     Wallis Bamberg, [place] 06/12/18 2707

## 2018-06-12 NOTE — ED Triage Notes (Signed)
The patient presented to the [facility] with a complaint of general body aches, chills and fatigue that started last night.

## 2018-06-14 ENCOUNTER — Encounter (HOSPITAL_BASED_OUTPATIENT_CLINIC_OR_DEPARTMENT_OTHER): Payer: Self-pay | Admitting: Emergency Medicine

## 2018-06-14 ENCOUNTER — Other Ambulatory Visit: Payer: Self-pay

## 2018-06-14 ENCOUNTER — Emergency Department (HOSPITAL_BASED_OUTPATIENT_CLINIC_OR_DEPARTMENT_OTHER)
Admission: EM | Admit: 2018-06-14 | Discharge: 2018-06-14 | Disposition: A | Discharge status: Home / Self Care | Payer: BLUE CROSS/BLUE SHIELD | Attending: Emergency Medicine | Admitting: Emergency Medicine

## 2018-06-14 DIAGNOSIS — J45909 Unspecified asthma, uncomplicated: Secondary | ICD-10-CM | POA: Insufficient documentation

## 2018-06-14 DIAGNOSIS — R519 Headache, unspecified: Secondary | ICD-10-CM

## 2018-06-14 DIAGNOSIS — R51 Headache: Secondary | ICD-10-CM | POA: Diagnosis not present

## 2018-06-14 MED ORDER — DIPHENHYDRAMINE HCL 50 MG/ML IJ SOLN
25.0000 mg | Freq: Once | INTRAMUSCULAR | Status: AC
  Administered 2018-06-14: 25 mg via INTRAVENOUS
  Filled 2018-06-14: qty 1

## 2018-06-14 MED ORDER — METOCLOPRAMIDE HCL 5 MG/ML IJ SOLN
10.0000 mg | Freq: Once | INTRAMUSCULAR | Status: AC
  Administered 2018-06-14: 10 mg via INTRAVENOUS
  Filled 2018-06-14: qty 2

## 2018-06-14 MED ORDER — BUTALBITAL-APAP-CAFFEINE 50-325-40 MG PO TABS: 1.0000 | ORAL_TABLET | Freq: Four times a day (QID) | ORAL | 0 refills | Status: AC | PRN

## 2018-06-14 MED ORDER — SODIUM CHLORIDE 0.9 % IV BOLUS
1000.0000 mL | Freq: Once | INTRAVENOUS | Status: AC
  Administered 2018-06-14: 1000 mL via INTRAVENOUS

## 2018-06-14 MED ORDER — KETOROLAC TROMETHAMINE 15 MG/ML IJ SOLN
15.0000 mg | Freq: Once | INTRAMUSCULAR | Status: AC
  Administered 2018-06-14: 15 mg via INTRAVENOUS
  Filled 2018-06-14: qty 1

## 2018-06-14 NOTE — ED Notes (Signed)
C/o ha and head pressure x 6 days  w nausea 1 time

## 2018-06-14 NOTE — ED Triage Notes (Signed)
Headache x5 days.  No hx of migraines. Nausea but no vomiting.

## 2018-06-26 NOTE — ED Provider Notes (Signed)
[facility] EMERGENCY DEPARTMENT Provider Note   CSN: [id number] Arrival date & time: 06/14/18  1633     History   Chief Complaint Chief Complaint  Patient presents with  . Headache    HPI Teresa Durham is a 23 y.o. female.  HPI   23 year old female with headache.  Gradual onset 5 to 6 days ago.  Denies any trauma or strain.  Describes the headache is diffuse.  No appreciable exacerbating relieving factors.  No neck pain or neck stiffness.  Some photophobia.  No change in visual acuity.  No acute numbness, tingling, focal loss of strength.  Past Medical History:  Diagnosis Date  . Asthma   . PCOS (polycystic ovarian syndrome)     Patient Active Problem List   Diagnosis Date Noted  . Murmur 04/04/2013  . Chest pressure 04/04/2013  . Dyspnea 04/04/2013  . Asthma 04/04/2013  . Costochondritis 04/04/2013    Past Surgical History:  Procedure Laterality Date  . TONSILLECTOMY       OB History    Gravida  0   Para  0   Term  0   Preterm  0   AB  0   Living  0     SAB  0   TAB  0   Ectopic  0   Multiple  0   Live Births  0            Home Medications    Prior to Admission medications   Medication Sig Start Date End Date Taking? Authorizing Provider  butalbital-acetaminophen-caffeine (FIORICET, ESGIC) [phone] MG tablet Take 1 tablet by mouth every 6 (six) hours as needed for headache. 06/14/18 06/14/19  Raeford RazorKohut, Valoria Tamburri, MD  Norethindrone-Ethinyl Estradiol-Fe Biphas (LO LOESTRIN FE) 1 MG-10 MCG / 10 MCG tablet Take by mouth.    [provider]  oseltamivir (TAMIFLU) 75 MG capsule Take 1 capsule (75 mg total) by mouth 2 (two) times daily. 06/12/18   Wallis BambergMani, Mario, PA-C    Family History No family history on file.  Social History Social History   Tobacco Use  . Smoking status: Never Smoker  . Smokeless tobacco: Never Used  Substance Use Topics  . Alcohol use: No  . Drug use: No     Allergies   Patient has no known  allergies.   Review of Systems Review of Systems  All systems reviewed and negative, other than as noted in HPI.  Physical Exam Updated Vital Signs BP 106/69 (BP Location: Right Arm)   Pulse 68   Temp 98.2 F (36.8 C) (Oral)   Resp 16   Ht 5\' 3"  (1.6 m)   Wt 94.7 kg   LMP 06/09/2018   SpO2 100%   BMI 36.98 kg/m   Physical Exam Vitals signs and nursing note reviewed.  Constitutional:      General: She is not in acute distress.    Appearance: She is well-developed. She is obese.  HENT:     Head: Normocephalic and atraumatic.  Eyes:     General:        Right eye: No discharge.        Left eye: No discharge.     Conjunctiva/sclera: Conjunctivae normal.  Neck:     Musculoskeletal: Neck supple. No neck rigidity.  Cardiovascular:     Rate and Rhythm: Normal rate and regular rhythm.     Heart sounds: Normal heart sounds. No murmur. No friction rub. No gallop.   Pulmonary:  Effort: Pulmonary effort is normal. No respiratory distress.     Breath sounds: Normal breath sounds.  Abdominal:     General: There is no distension.     Palpations: Abdomen is soft.     Tenderness: There is no abdominal tenderness.  Musculoskeletal:        General: No tenderness.  Lymphadenopathy:     Cervical: No cervical adenopathy.  Skin:    General: Skin is warm and dry.  Neurological:     General: No focal deficit present.     Mental Status: She is alert and oriented to person, place, and time.     Cranial Nerves: No cranial nerve deficit.     Sensory: No sensory deficit.     Motor: No weakness.     Coordination: Coordination normal.     Gait: Gait normal.  Psychiatric:        Behavior: Behavior normal.        Thought Content: Thought content normal.      ED Treatments / Results  Labs (all labs ordered are listed, but only abnormal results are displayed) Labs Reviewed - No data to display  EKG None  Radiology No results found.  Procedures Procedures (including  critical care time)  Medications Ordered in ED Medications  sodium chloride 0.9 % bolus 1,000 mL ( Intravenous Stopped 06/14/18 2045)  ketorolac (TORADOL) 15 MG/ML injection 15 mg (15 mg Intravenous Given 06/14/18 1943)  metoCLOPramide (REGLAN) injection 10 mg (10 mg Intravenous Given 06/14/18 1943)  diphenhydrAMINE (BENADRYL) injection 25 mg (25 mg Intravenous Given 06/14/18 1943)     Initial Impression / Assessment and Plan / ED Course  I have reviewed the triage vital signs and the nursing notes.  Pertinent labs & imaging results that were available during my care of the patient were reviewed by me and considered in my medical decision making (see chart for details).     I have reviewed the triage vital signs and the nursing notes. Prior records were reviewed for additional information.    Pertinent labs & imaging results that were available during my care of the patient were reviewed by me and considered in my medical decision making (see chart for details).  23 year old female with headache.  Suspect primary HA. Consider emergent secondary causes such as bleed, infectious or mass but doubt. There is no history of trauma. Pt has a nonfocal neurological exam. Afebrile and neck supple. No use of blood thinning medication. Consider ocular etiology such as acute angle closure glaucoma but doubt. Pt denies acute change in visual acuity and eye exam unremarkable.. Doubt CO poisoning. No contacts with similar symptoms. Doubt venous thrombosis. Doubt carotid or vertebral arteries dissection. Symptoms improved with meds. Feel that can be safely discharged, but strict return precautions discussed. Outpt fu.   Final Clinical Impressions(s) / ED Diagnoses   Final diagnoses:  Nonintractable headache, unspecified chronicity pattern, unspecified headache type    ED Discharge Orders         Ordered    butalbital-acetaminophen-caffeine (FIORICET, ESGIC) 50-325-40 MG tablet  Every 6 hours PRN      06/14/18 2041           Raeford Razor, MD 06/26/18 1245

## 2018-06-28 DIAGNOSIS — Z3046 Encounter for surveillance of implantable subdermal contraceptive: Secondary | ICD-10-CM | POA: Diagnosis not present

## 2019-01-12 DIAGNOSIS — Z118 Encounter for screening for other infectious and parasitic diseases: Secondary | ICD-10-CM | POA: Diagnosis not present

## 2019-01-12 DIAGNOSIS — Z113 Encounter for screening for infections with a predominantly sexual mode of transmission: Secondary | ICD-10-CM | POA: Diagnosis not present

## 2019-01-12 DIAGNOSIS — N941 Unspecified dyspareunia: Secondary | ICD-10-CM | POA: Diagnosis not present

## 2019-01-12 DIAGNOSIS — Z3009 Encounter for other general counseling and advice on contraception: Secondary | ICD-10-CM | POA: Diagnosis not present

## 2019-01-12 DIAGNOSIS — N898 Other specified noninflammatory disorders of vagina: Secondary | ICD-10-CM | POA: Diagnosis not present

## 2019-01-12 DIAGNOSIS — R1032 Left lower quadrant pain: Secondary | ICD-10-CM | POA: Diagnosis not present

## 2019-01-30 DIAGNOSIS — R1032 Left lower quadrant pain: Secondary | ICD-10-CM | POA: Diagnosis not present

## 2019-02-10 DIAGNOSIS — N760 Acute vaginitis: Secondary | ICD-10-CM | POA: Diagnosis not present

## 2019-02-10 DIAGNOSIS — Z113 Encounter for screening for infections with a predominantly sexual mode of transmission: Secondary | ICD-10-CM | POA: Diagnosis not present

## 2019-02-10 DIAGNOSIS — N930 Postcoital and contact bleeding: Secondary | ICD-10-CM | POA: Diagnosis not present

## 2019-02-10 DIAGNOSIS — Z118 Encounter for screening for other infectious and parasitic diseases: Secondary | ICD-10-CM | POA: Diagnosis not present

## 2019-03-06 ENCOUNTER — Other Ambulatory Visit: Payer: Self-pay

## 2019-03-06 ENCOUNTER — Ambulatory Visit (INDEPENDENT_AMBULATORY_CARE_PROVIDER_SITE_OTHER): Payer: BLUE CROSS/BLUE SHIELD

## 2019-03-06 ENCOUNTER — Telehealth: Payer: Self-pay | Admitting: *Deleted

## 2019-03-06 ENCOUNTER — Other Ambulatory Visit: Payer: Self-pay | Admitting: Podiatry

## 2019-03-06 ENCOUNTER — Ambulatory Visit (INDEPENDENT_AMBULATORY_CARE_PROVIDER_SITE_OTHER): Payer: BLUE CROSS/BLUE SHIELD | Admitting: Podiatry

## 2019-03-06 DIAGNOSIS — S92132A Displaced fracture of posterior process of left talus, initial encounter for closed fracture: Secondary | ICD-10-CM | POA: Diagnosis not present

## 2019-03-06 DIAGNOSIS — S82899A Other fracture of unspecified lower leg, initial encounter for closed fracture: Secondary | ICD-10-CM

## 2019-03-06 DIAGNOSIS — M79672 Pain in left foot: Secondary | ICD-10-CM

## 2019-03-06 NOTE — Telephone Encounter (Signed)
Orders to L. Cox, CMA for pre-cert and faxed to [facility]. 

## 2019-03-06 NOTE — Telephone Encounter (Signed)
-----   Message from Edrick Kins, DPM sent at 03/06/2019  9:53 AM EDT ----- Regarding: MRI left ankle Please order MRI left ankle w/out contrast.   Dx: shepard's fracture left ankle  Thanks, Dr. Amalia Hailey

## 2019-03-09 NOTE — Progress Notes (Signed)
   HPI: 23 y.o. female presenting today as a new patient with a chief complaint of a left ankle injury that occurred about one year ago. She states she was walking in heels and fell. She has been experiencing a popping sensation of the proximal Achilles and lateral ankle with ambulation since the injury. She reports continued pain with plantar flexion and some associated swelling. Wearing heels increases the pain. She has not had any recent treatment of the symptoms. Patient is here for further evaluation and treatment.   Past Medical History:  Diagnosis Date  . Asthma   . PCOS (polycystic ovarian syndrome)      Physical Exam: General: The patient is alert and oriented x3 in no acute distress.  Dermatology: Skin is warm, dry and supple bilateral lower extremities. Negative for open lesions or macerations.  Vascular: Palpable pedal pulses bilaterally. No edema or erythema noted. Capillary refill within normal limits.  Neurological: Epicritic and protective threshold grossly intact bilaterally.   Musculoskeletal Exam: Crepitus with plantar flexion of the left ankle joint.   Radiographic Exam:  Normal osseous mineralization. Joint spaces preserved. Steida's process with possible shepard's fracture noted.   Assessment: 1. Steida's process / Shepard's fracture left    Plan of Care:  1. Patient evaluated. X-Rays reviewed.  2. MRI of left ankle ordered due to acute re-injury of the posterior ankle.  3. Continue RICE therapy daily.  4. Return to clinic after MRI to review the results.    Works at home for [employer].       Edrick Kins, DPM [facility]  Dr. Edrick Kins, DPM    [address], [place] [address]                Office [phone]  Fax [phone]

## 2019-03-23 ENCOUNTER — Encounter (HOSPITAL_BASED_OUTPATIENT_CLINIC_OR_DEPARTMENT_OTHER): Payer: Self-pay | Admitting: *Deleted

## 2019-03-23 ENCOUNTER — Telehealth: Payer: Self-pay | Admitting: *Deleted

## 2019-03-23 ENCOUNTER — Emergency Department (HOSPITAL_BASED_OUTPATIENT_CLINIC_OR_DEPARTMENT_OTHER)
Admission: EM | Admit: 2019-03-23 | Discharge: 2019-03-23 | Disposition: A | Discharge status: Home / Self Care | Payer: BC Managed Care – PPO | Attending: Emergency Medicine | Admitting: Emergency Medicine

## 2019-03-23 ENCOUNTER — Other Ambulatory Visit: Payer: Self-pay

## 2019-03-23 DIAGNOSIS — J029 Acute pharyngitis, unspecified: Secondary | ICD-10-CM | POA: Diagnosis not present

## 2019-03-23 DIAGNOSIS — Z20822 Contact with and (suspected) exposure to covid-19: Secondary | ICD-10-CM

## 2019-03-23 DIAGNOSIS — Z20828 Contact with and (suspected) exposure to other viral communicable diseases: Secondary | ICD-10-CM | POA: Insufficient documentation

## 2019-03-23 DIAGNOSIS — J45909 Unspecified asthma, uncomplicated: Secondary | ICD-10-CM | POA: Diagnosis not present

## 2019-03-23 DIAGNOSIS — R0989 Other specified symptoms and signs involving the circulatory and respiratory systems: Secondary | ICD-10-CM | POA: Diagnosis not present

## 2019-03-23 NOTE — Discharge Instructions (Addendum)
Person Under Monitoring Name: Teresa Durham  Location: [address] [place] [address]   Infection Prevention Recommendations for Individuals Confirmed to have, or Being Evaluated for, 2019 Novel Coronavirus (COVID-19) Infection Who Receive Care at Home  Individuals who are confirmed to have, or are being evaluated for, COVID-19 should follow the prevention steps below until a healthcare provider or local or state health department says they can return to normal activities.  Stay home except to get medical care You should restrict activities outside your home, except for getting medical care. Do not go to work, school, or public areas, and do not use public transportation or taxis.  Call ahead before visiting your doctor Before your medical appointment, call the healthcare provider and tell them that you have, or are being evaluated for, COVID-19 infection. This will help the healthcare providers office take steps to keep other people from getting infected. Ask your healthcare provider to call the local or state health department.  Monitor your symptoms Seek prompt medical attention if your illness is worsening (e.g., difficulty breathing). Before going to your medical appointment, call the healthcare provider and tell them that you have, or are being evaluated for, COVID-19 infection. Ask your healthcare provider to call the local or state health department.  Wear a facemask You should wear a facemask that covers your nose and mouth when you are in the same room with other people and when you visit a healthcare provider. People who live with or visit you should also wear a facemask while they are in the same room with you.  Separate yourself from other people in your home As much as possible, you should stay in a different room from other people in your home. Also, you should use a separate bathroom, if available.  Avoid sharing household items You should not  share dishes, drinking glasses, cups, eating utensils, towels, bedding, or other items with other people in your home. After using these items, you should wash them thoroughly with soap and water.  Cover your coughs and sneezes Cover your mouth and nose with a tissue when you cough or sneeze, or you can cough or sneeze into your sleeve. Throw used tissues in a lined trash can, and immediately wash your hands with soap and water for at least 20 seconds or use an alcohol-based hand rub.  Wash your [employer] your hands often and thoroughly with soap and water for at least 20 seconds. You can use an alcohol-based hand sanitizer if soap and water are not available and if your hands are not visibly dirty. Avoid touching your eyes, nose, and mouth with unwashed hands.   Prevention Steps for Caregivers and Household Members of Individuals Confirmed to have, or Being Evaluated for, COVID-19 Infection Being Cared for in the Home  If you live with, or provide care at home for, a person confirmed to have, or being evaluated for, COVID-19 infection please follow these guidelines to prevent infection:  Follow healthcare providers instructions Make sure that you understand and can help the patient follow any healthcare provider instructions for all care.  Provide for the patients basic needs You should help the patient with basic needs in the home and provide support for getting groceries, prescriptions, and other personal needs.  Monitor the patients symptoms If they are getting sicker, call his or her medical provider and tell them that the patient has, or is being evaluated for, COVID-19 infection. This will help the healthcare providers office  take steps to keep other people from getting infected. Ask the healthcare provider to call the local or state health department.  Limit the number of people who have contact with the patient If possible, have only one caregiver for the  patient. Other household members should stay in another home or place of residence. If this is not possible, they should stay in another room, or be separated from the patient as much as possible. Use a separate bathroom, if available. Restrict visitors who do not have an essential need to be in the home.  Keep older adults, very young children, and other sick people away from the patient Keep older adults, very young children, and those who have compromised immune systems or chronic health conditions away from the patient. This includes people with chronic heart, lung, or kidney conditions, diabetes, and cancer.  Ensure good ventilation Make sure that shared spaces in the home have good air flow, such as from an air conditioner or an opened window, weather permitting.  Wash your hands often Wash your hands often and thoroughly with soap and water for at least 20 seconds. You can use an alcohol based hand sanitizer if soap and water are not available and if your hands are not visibly dirty. Avoid touching your eyes, nose, and mouth with unwashed hands. Use disposable paper towels to dry your hands. If not available, use dedicated cloth towels and replace them when they become wet.  Wear a facemask and gloves Wear a disposable facemask at all times in the room and gloves when you touch or have contact with the patients blood, body fluids, and/or secretions or excretions, such as sweat, saliva, sputum, nasal mucus, vomit, urine, or feces.  Ensure the mask fits over your nose and mouth tightly, and do not touch it during use. Throw out disposable facemasks and gloves after using them. Do not reuse. Wash your hands immediately after removing your facemask and gloves. If your personal clothing becomes contaminated, carefully remove clothing and launder. Wash your hands after handling contaminated clothing. Place all used disposable facemasks, gloves, and other waste in a lined container before  disposing them with other household waste. Remove gloves and wash your hands immediately after handling these items.  Do not share dishes, glasses, or other household items with the patient Avoid sharing household items. You should not share dishes, drinking glasses, cups, eating utensils, towels, bedding, or other items with a patient who is confirmed to have, or being evaluated for, COVID-19 infection. After the person uses these items, you should wash them thoroughly with soap and water.  Wash laundry thoroughly Immediately remove and wash clothes or bedding that have blood, body fluids, and/or secretions or excretions, such as sweat, saliva, sputum, nasal mucus, vomit, urine, or feces, on them. Wear gloves when handling laundry from the patient. Read and follow directions on labels of laundry or clothing items and detergent. In general, wash and dry with the warmest temperatures recommended on the label.  Clean all areas the individual has used often Clean all touchable surfaces, such as counters, tabletops, doorknobs, bathroom fixtures, toilets, phones, keyboards, tablets, and bedside tables, every day. Also, clean any surfaces that may have blood, body fluids, and/or secretions or excretions on them. Wear gloves when cleaning surfaces the patient has come in contact with. Use a diluted bleach solution (e.g., dilute bleach with 1 part bleach and 10 parts water) or a household disinfectant with a label that says EPA-registered for coronaviruses. To make a bleach  solution at home, add 1 tablespoon of bleach to 1 quart (4 cups) of water. For a larger supply, add  cup of bleach to 1 gallon (16 cups) of water. Read labels of cleaning products and follow recommendations provided on product labels. Labels contain instructions for safe and effective use of the cleaning product including precautions you should take when applying the product, such as wearing gloves or eye protection and making sure you  have good ventilation during use of the product. Remove gloves and wash hands immediately after cleaning.  Monitor yourself for signs and symptoms of illness Caregivers and household members are considered close contacts, should monitor their health, and will be asked to limit movement outside of the home to the extent possible. Follow the monitoring steps for close contacts listed on the symptom monitoring form.   ? If you have additional questions, contact your local health department or call the epidemiologist on call at [phone] (available 24/7). ? This guidance is subject to change. For the most up-to-date guidance from [facility], please refer to their website: YouBlogs.pl

## 2019-03-23 NOTE — ED Triage Notes (Signed)
Scratchy throat , throat nasal drainage x 1 week,  No sob,  No body aches

## 2019-03-23 NOTE — Telephone Encounter (Signed)
[facility] states pt is scheduled for MRI Monday and her [employer] needs to be pre-certed prior to 2:19XJ tomorrow. Reprinted orders given to Gretta Arab, RN for pre-cert.

## 2019-03-23 NOTE — ED Provider Notes (Signed)
[facility] EMERGENCY DEPARTMENT Provider Note   CSN: [id number] Arrival date & time: 03/23/19  6389     History   Chief Complaint Chief Complaint  Patient presents with  . Sore Throat    HPI Teresa Durham is a 23 y.o. female.     23 year old female with past medical history below who presents with sore throat and runny nose.  Patient has had 1 week of scratchy throat associated with runny nose and postnasal drip.  She takes Zyrtec for allergies.  She feels like this is a [place] worse than usual allergies.  No cough, fevers, body aches, vomiting, diarrhea, or other complaints.  Several days ago she was around a crowd of people and now with the pandemic cases increasing, she is concerned about the possibility of COVID-19 because her boyfriend is immunosuppressed.  She requests testing.  The history is provided by the patient.  Sore Throat    Past Medical History:  Diagnosis Date  . Asthma   . PCOS (polycystic ovarian syndrome)     Patient Active Problem List   Diagnosis Date Noted  . Murmur 04/04/2013  . Chest pressure 04/04/2013  . Dyspnea 04/04/2013  . Asthma 04/04/2013  . Costochondritis 04/04/2013    Past Surgical History:  Procedure Laterality Date  . TONSILLECTOMY       OB History    Gravida  0   Para  0   Term  0   Preterm  0   AB  0   Living  0     SAB  0   TAB  0   Ectopic  0   Multiple  0   Live Births  0            Home Medications    Prior to Admission medications   Medication Sig Start Date End Date Taking? Authorizing Provider  butalbital-acetaminophen-caffeine (FIORICET, ESGIC) 50-325-40 MG tablet Take 1 tablet by mouth every 6 (six) hours as needed for headache. Patient not taking: Reported on 03/06/2019 06/14/18 06/14/19  Raeford Razor, MD  KAITLIB FE 0.8-25 MG-MCG tablet CHEW AND SWALLOW 1 (ONE) TABLET CHEWABLE DAILY 01/17/19   [provider]  Norethindrone-Ethinyl Estradiol-Fe Biphas (LO LOESTRIN  FE) 1 MG-10 MCG / 10 MCG tablet Take by mouth.    [provider]  oseltamivir (TAMIFLU) 75 MG capsule Take 1 capsule (75 mg total) by mouth 2 (two) times daily. Patient not taking: Reported on 03/06/2019 06/12/18   Wallis Bamberg, PA-C    Family History No family history on file.  Social History Social History   Tobacco Use  . Smoking status: Never Smoker  . Smokeless tobacco: Never Used  Substance Use Topics  . Alcohol use: No  . Drug use: No     Allergies   Patient has no known allergies.   Review of Systems Review of Systems All other systems reviewed and are negative except that which was mentioned in HPI   Physical Exam Updated Vital Signs BP 123/77 (BP Location: Right Arm)   Pulse 85   Temp 98.6 F (37 C) (Oral)   Resp 16   Ht 5\' 3"  (1.6 m)   Wt 97.2 kg   LMP 03/20/2019 (Exact Date)   SpO2 100%   BMI 37.97 kg/m   Physical Exam Vitals signs and nursing note reviewed.  Constitutional:      General: She is not in acute distress.    Appearance: She is well-developed.  HENT:  Head: Normocephalic and atraumatic.     Mouth/Throat:     Mouth: Mucous membranes are moist.     Pharynx: Uvula midline.     Comments: Trace uvular swelling and mild erythema posterior oropharynx with no tonsillar enlargement or exudates Eyes:     Conjunctiva/sclera: Conjunctivae normal.  Neck:     Musculoskeletal: Neck supple.  Pulmonary:     Effort: Pulmonary effort is normal.  Lymphadenopathy:     Cervical: No cervical adenopathy.  Skin:    General: Skin is warm and dry.  Neurological:     Mental Status: She is alert and oriented to person, place, and time.  Psychiatric:        Mood and Affect: Mood normal.        Behavior: Behavior normal.        Judgment: Judgment normal.      ED Treatments / Results  Labs (all labs ordered are listed, but only abnormal results are displayed) Labs Reviewed  NOVEL CORONAVIRUS, NAA (HOSP ORDER, SEND-OUT TO REF LAB; TAT  18-24 HRS)    EKG None  Radiology No results found.  Procedures Procedures (including critical care time)  Medications Ordered in ED Medications - No data to display   Initial Impression / Assessment and Plan / ED Course  I have reviewed the triage vital signs and the nursing notes.        Well-appearing, breathing comfortably, normal vital signs.  Given runny nose and reassuring appearance of throat, strep pharyngitis seems very unlikely.  I explained that overall her symptoms are not highly suggestive of COVID-19 but I did recommend testing given current pandemic.  Discussed strict quarantine until test results available.  Discussed supportive measures and reviewed return precautions.  Teresa Durham was evaluated in Emergency Department on 03/23/2019 for the symptoms described in the history of present illness. She was evaluated in the context of the global COVID-19 pandemic, which necessitated consideration that the patient might be at risk for infection with the SARS-CoV-2 virus that causes COVID-19. Institutional protocols and algorithms that pertain to the evaluation of patients at risk for COVID-19 are in a state of rapid change based on information released by regulatory bodies including the CDC and federal and state organizations. These policies and algorithms were followed during the patient's care in the ED.  Final Clinical Impressions(s) / ED Diagnoses   Final diagnoses:  Encounter for laboratory testing for COVID-19 virus  Sore throat  Runny nose    ED Discharge Orders    None       Harman Langhans, Wenda Overland, MD 03/23/19 0930

## 2019-03-24 LAB — NOVEL CORONAVIRUS, NAA (HOSP ORDER, SEND-OUT TO REF LAB; TAT 18-24 HRS): SARS-CoV-2, NAA: NOT DETECTED

## 2019-03-24 NOTE — Telephone Encounter (Signed)
I informed [facility] that [facility] would not review information for the pre-cert until pt had updated her address in their system. Weldon Picking states she will cancel the MRI. I informed pt of [employer] request for updated information and to call me and [facility] once completed. Pt states understanding.

## 2019-03-24 NOTE — Telephone Encounter (Signed)
[employer] [facility] states she can find pt in their database but can not match address and must be able to match.

## 2019-03-27 ENCOUNTER — Other Ambulatory Visit: Payer: BLUE CROSS/BLUE SHIELD

## 2019-05-25 ENCOUNTER — Telehealth: Payer: Self-pay | Admitting: *Deleted

## 2019-05-25 DIAGNOSIS — S92132A Displaced fracture of posterior process of left talus, initial encounter for closed fracture: Secondary | ICD-10-CM

## 2019-05-25 DIAGNOSIS — S82899A Other fracture of unspecified lower leg, initial encounter for closed fracture: Secondary | ICD-10-CM

## 2019-05-25 NOTE — Telephone Encounter (Signed)
Pt states she wants to get a MRI rescheduled.

## 2019-05-31 NOTE — Telephone Encounter (Signed)
Pt is calling to follow up on previous message regarding her MRI. Pt would like to have a new referral sent in for an MRI using her new [employer]. Pt has not been seen in our office with the new insurance yet so a card is not scanned in but her chart has been updated to reflect the new [employer] member ID.  Please give patient a call.

## 2019-06-01 NOTE — Telephone Encounter (Signed)
I informed pt the MRI had been reordered and was waiting pre-cert.

## 2019-06-01 NOTE — Addendum Note (Signed)
Addended by: Alphia Kava D on: 06/01/2019 09:56 AM   Modules accepted: Orders

## 2019-06-01 NOTE — Telephone Encounter (Signed)
Orders to Dr. Evans assistant for pre-cert, faxed to [facility]. 

## 2019-06-09 ENCOUNTER — Telehealth: Payer: Self-pay | Admitting: *Deleted

## 2019-06-09 NOTE — Telephone Encounter (Signed)
[facility] states pt is scheduled for MRI 06/12/2019 and have 2 insurance that need pre-cert, but they do not have a card.

## 2019-06-09 NOTE — Telephone Encounter (Signed)
Pt called the office and states she is going to try scanning her new insurance card in via MyChart, if she is unable to she is going to bring the card by the office on Monday so that a copy can be added.

## 2019-06-09 NOTE — Telephone Encounter (Signed)
[facility] called for PA. I informed [facility] that pt needs to bring an insurance card(s) to our office.

## 2019-06-12 ENCOUNTER — Other Ambulatory Visit: Payer: BC Managed Care – PPO

## 2019-06-13 ENCOUNTER — Telehealth: Payer: Self-pay | Admitting: *Deleted

## 2019-06-13 NOTE — Telephone Encounter (Signed)
[facility] asked if pt had brought in new insurance.

## 2019-06-13 NOTE — Telephone Encounter (Signed)
I called pt and asked if she would bring in her up dated insurance card so we could pre-cert for the MRI. Pt states she will bring in the morning so we could copy.

## 2019-06-15 ENCOUNTER — Telehealth: Payer: Self-pay | Admitting: *Deleted

## 2019-06-15 NOTE — Telephone Encounter (Signed)
Sent Dr Logan Bores a message for a peer to peer and the phone number is [phone] and would need the member ID, DOB, ID which is [id number] and spoke with [facility]. Misty Stanley

## 2019-06-23 DIAGNOSIS — G43009 Migraine without aura, not intractable, without status migrainosus: Secondary | ICD-10-CM | POA: Insufficient documentation

## 2019-12-10 IMAGING — US US PELVIS COMPLETE
1 series · 13 of 25 positions shown · non-contrast
Comparison: None.

CLINICAL DATA: Pelvic pain

EXAM:
TRANSABDOMINAL AND TRANSVAGINAL ULTRASOUND OF PELVIS
DOPPLER ULTRASOUND OF OVARIES
TECHNIQUE: Both transabdominal and transvaginal ultrasound examinations of the
pelvis were performed. Transabdominal technique was performed for
global imaging of the pelvis including uterus, ovaries, adnexal
regions, and pelvic cul-de-sac.
It was necessary to proceed with endovaginal exam following the
transabdominal exam to visualize the endometrium and ovaries. Color
and duplex Doppler ultrasound was utilized to evaluate blood flow to
the ovaries.

[Series 1: us pelvis complete · 0.24mm/px · 13 of 68 slices shown]
[im 1/68]
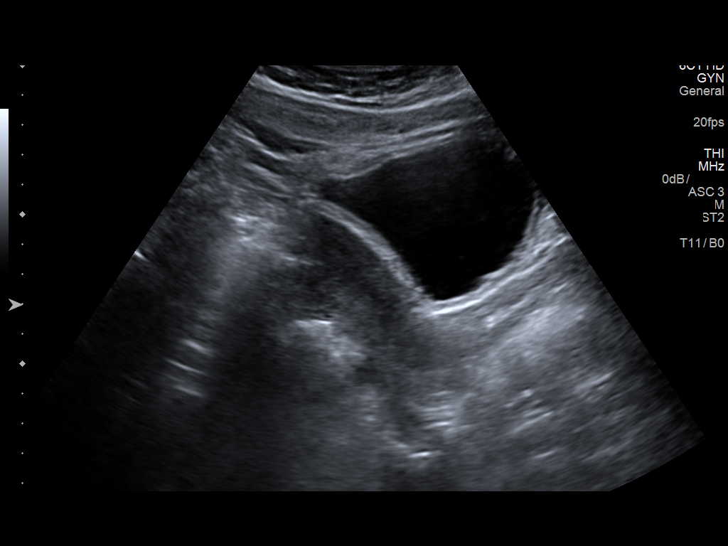
[im 6/68]
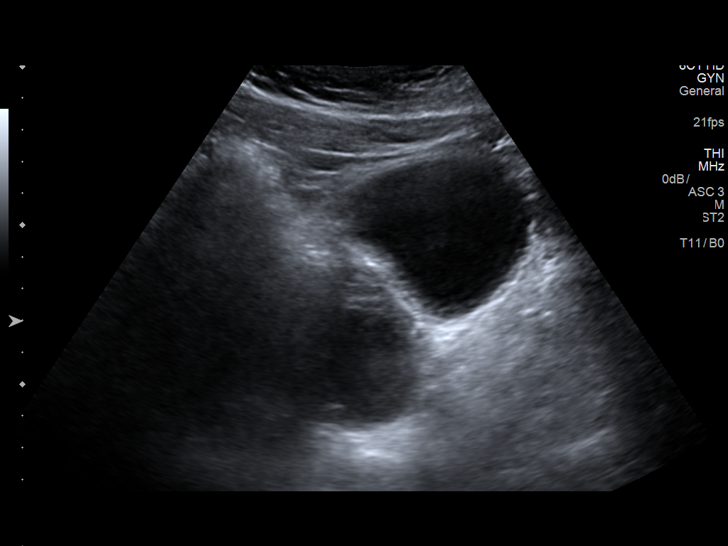
[im 12/68]
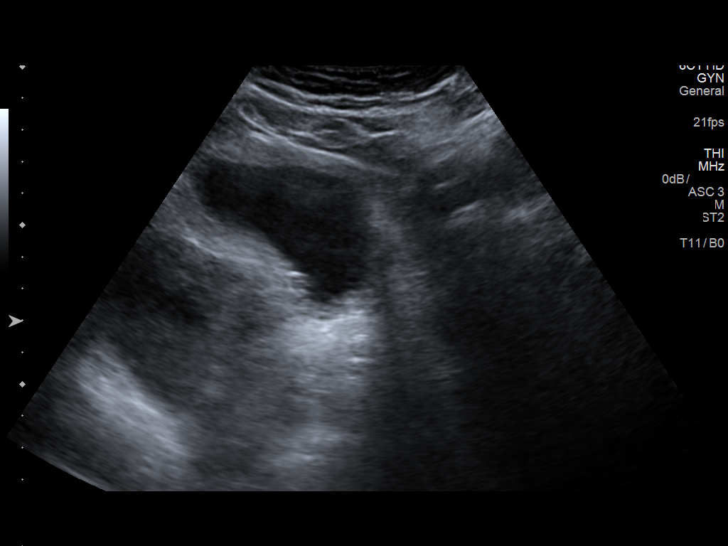
[im 17/68]
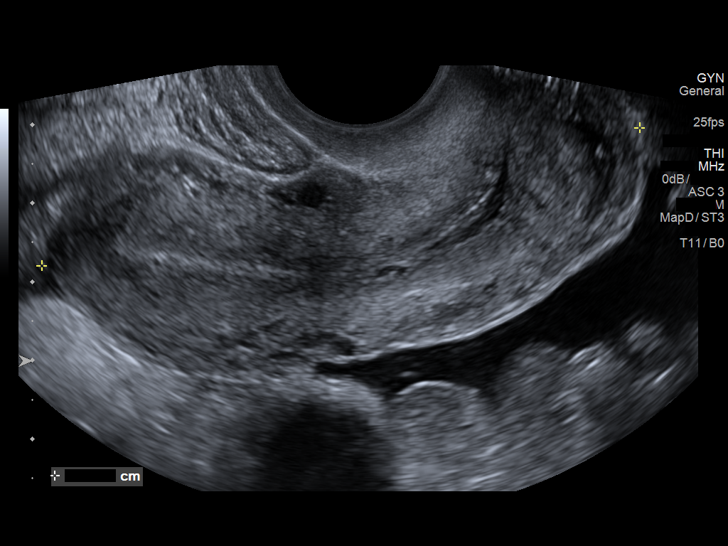
[im 23/68]
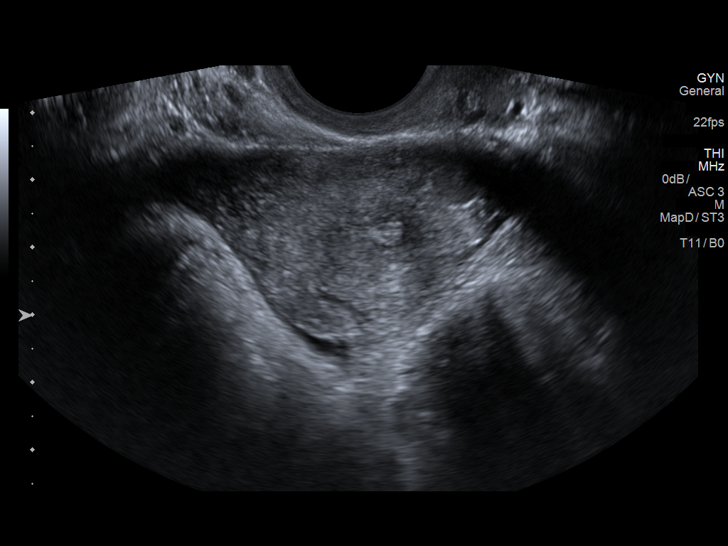
[im 28/68]
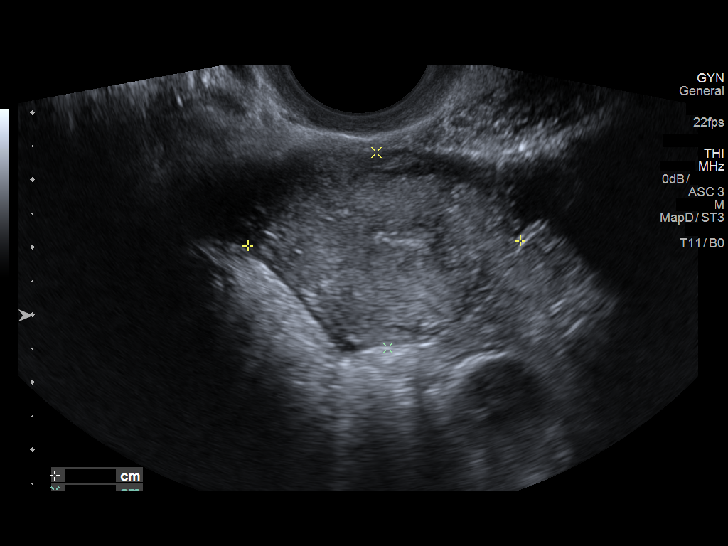
[im 34/68]
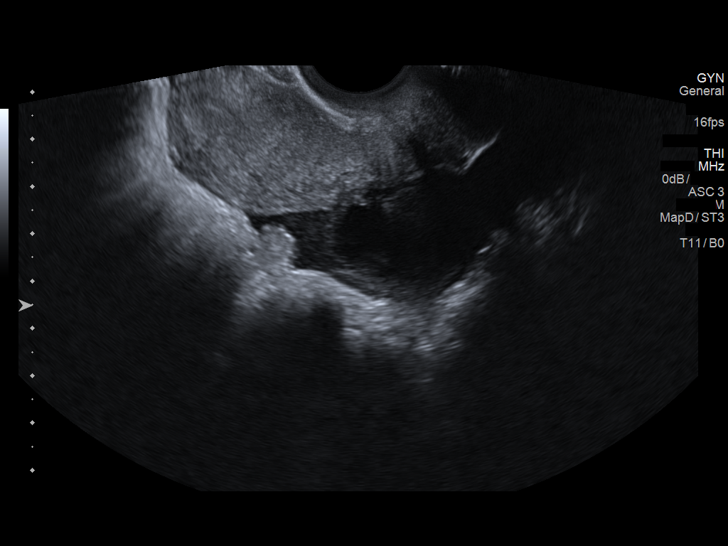
[im 40/68]
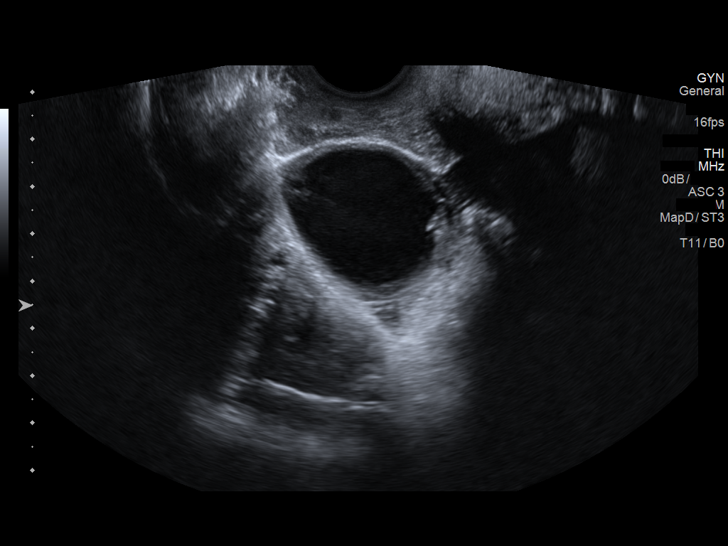
[im 45/68]
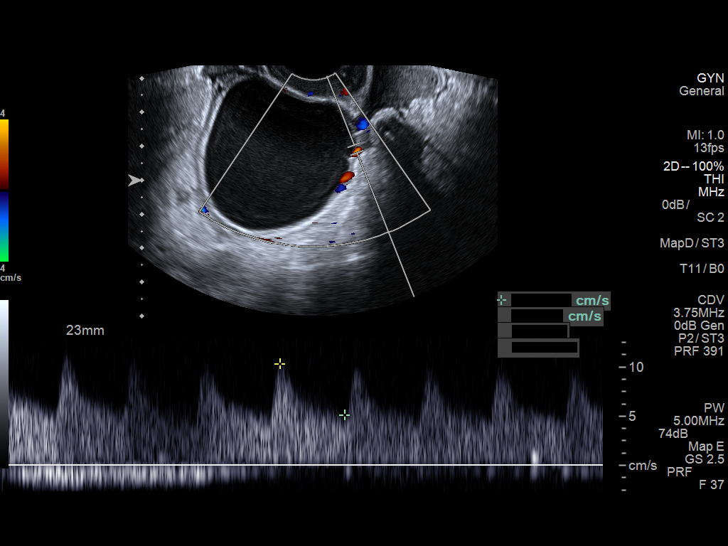
[im 51/68]
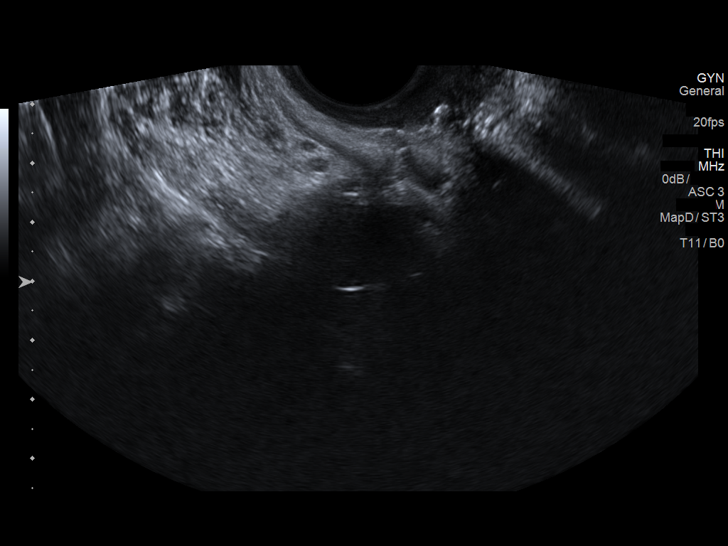
[im 56/68]
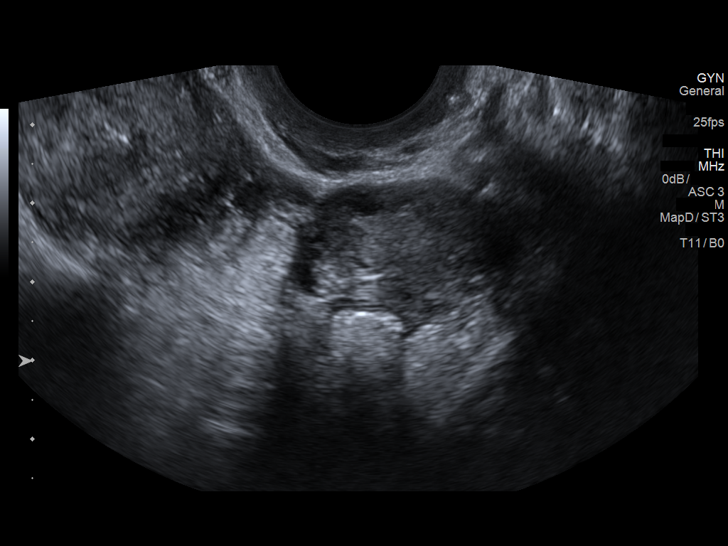
[im 62/68]
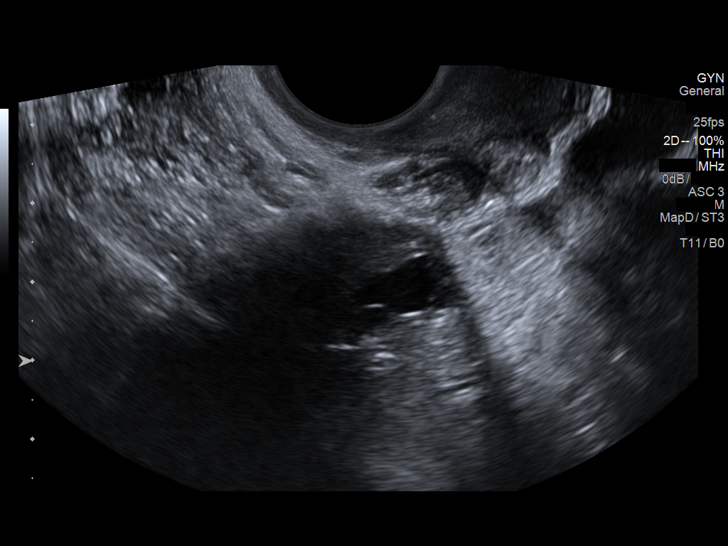
[im 68/68]
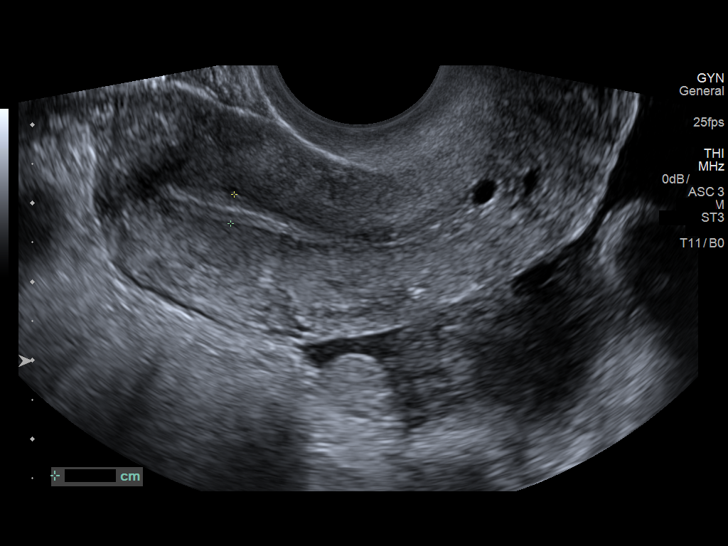

[13 of 25 positions shown; findings below may reference images not displayed]

FINDINGS: Uterus

Measurements: 7.8 x 4.0 x 2.9 cm. No fibroids or other mass
visualized.

Endometrium

Thickness: 3.7 mm.  No focal abnormality visualized.

Right ovary

Measurements: 4.6 x 5.1 x 4.8 cm.  Cyst measuring 3.8 x 4.4 x 4.4 cm

Left ovary

Measurements: 3.3 x 3 x 1.9 cm.  Normal appearance/no adnexal mass.

Pulsed Doppler evaluation of both ovaries demonstrates normal
low-resistance arterial and venous waveforms.

Other findings

Small free fluid in the pelvis.
IMPRESSION: 1. Negative for ovarian torsion
2. 4.4 cm right ovarian cyst. This is almost certainly benign, and
no specific imaging follow up is recommended according to the
Society of Radiologists in [id number] Consensus Conference
Statement (Inscrutable Tama et al. Management of Asymptomatic Ovarian and
Other Adnexal Cysts Imaged at US: Society of Radiologists in
3. Small amount of free fluid in the pelvis

## 2020-02-29 ENCOUNTER — Other Ambulatory Visit: Payer: Self-pay

## 2020-02-29 ENCOUNTER — Emergency Department (HOSPITAL_BASED_OUTPATIENT_CLINIC_OR_DEPARTMENT_OTHER)
Admission: EM | Admit: 2020-02-29 | Discharge: 2020-02-29 | Disposition: A | Discharge status: Home / Self Care | Payer: 59 | Attending: Emergency Medicine | Admitting: Emergency Medicine

## 2020-02-29 ENCOUNTER — Encounter (HOSPITAL_BASED_OUTPATIENT_CLINIC_OR_DEPARTMENT_OTHER): Payer: Self-pay | Admitting: *Deleted

## 2020-02-29 DIAGNOSIS — Z20822 Contact with and (suspected) exposure to covid-19: Secondary | ICD-10-CM | POA: Insufficient documentation

## 2020-02-29 DIAGNOSIS — J45909 Unspecified asthma, uncomplicated: Secondary | ICD-10-CM | POA: Diagnosis not present

## 2020-02-29 DIAGNOSIS — J069 Acute upper respiratory infection, unspecified: Secondary | ICD-10-CM

## 2020-02-29 DIAGNOSIS — R0981 Nasal congestion: Secondary | ICD-10-CM | POA: Diagnosis present

## 2020-02-29 LAB — RESPIRATORY PANEL BY RT PCR (FLU A&B, COVID)
Influenza A by PCR: NEGATIVE
Influenza B by PCR: NEGATIVE
SARS Coronavirus 2 by RT PCR: NEGATIVE

## 2020-02-29 MED ORDER — ACETAMINOPHEN 325 MG PO TABS
650.0000 mg | ORAL_TABLET | Freq: Once | ORAL | Status: AC
  Administered 2020-02-29: 650 mg via ORAL
  Filled 2020-02-29: qty 2

## 2020-02-29 MED ORDER — IBUPROFEN 400 MG PO TABS
600.0000 mg | ORAL_TABLET | Freq: Once | ORAL | Status: AC
  Administered 2020-02-29: 600 mg via ORAL
  Filled 2020-02-29: qty 1

## 2020-02-29 MED ORDER — IBUPROFEN 800 MG PO TABS: 800.0000 mg | ORAL_TABLET | Freq: Three times a day (TID) | ORAL | 0 refills | Status: DC | PRN

## 2020-02-29 MED ORDER — BENZONATATE 100 MG PO CAPS: 100.0000 mg | ORAL_CAPSULE | Freq: Three times a day (TID) | ORAL | 0 refills | Status: DC | PRN

## 2020-02-29 NOTE — ED Triage Notes (Signed)
Cough, runny nose, facial pressure. States she feels like she has a sinus infection. No Covid exposure.

## 2020-02-29 NOTE — ED Provider Notes (Signed)
[facility] EMERGENCY DEPARTMENT Provider Note   CSN: [id number] Arrival date & time: 02/29/20  1332     History No chief complaint on file.   Teresa Durham is a 24 y.o. female.  HPI Patient is a 24 year old female with a medical history as noted below.  She states about 6 days ago she started experiencing sinus pressure, congestion, rhinorrhea, sneezing, mild sore throat.  Her sore throat has alleviated.  Last night she started experiencing a dry cough with intermittent headaches as well.  She reports some central chest pain only when coughing.  No shortness of breath, abdominal pain, fevers, chills, nausea, vomiting, diarrhea.  She has been vaccinated for COVID-19.     Past Medical History:  Diagnosis Date  . Asthma   . PCOS (polycystic ovarian syndrome)     Patient Active Problem List   Diagnosis Date Noted  . Murmur 04/04/2013  . Chest pressure 04/04/2013  . Dyspnea 04/04/2013  . Asthma 04/04/2013  . Costochondritis 04/04/2013    Past Surgical History:  Procedure Laterality Date  . TONSILLECTOMY       OB History    Gravida  0   Para  0   Term  0   Preterm  0   AB  0   Living  0     SAB  0   TAB  0   Ectopic  0   Multiple  0   Live Births  0           No family history on file.  Social History   Tobacco Use  . Smoking status: Never Smoker  . Smokeless tobacco: Never Used  Vaping Use  . Vaping Use: Never used  Substance Use Topics  . Alcohol use: No  . Drug use: No    Home Medications Prior to Admission medications   Medication Sig Start Date End Date Taking? Authorizing Provider  KAITLIB FE 0.8-25 MG-MCG tablet CHEW AND SWALLOW 1 (ONE) TABLET CHEWABLE DAILY 01/17/19   [provider]  Norethindrone-Ethinyl Estradiol-Fe Biphas (LO LOESTRIN FE) 1 MG-10 MCG / 10 MCG tablet Take by mouth.    [provider]  oseltamivir (TAMIFLU) 75 MG capsule Take 1 capsule (75 mg total) by mouth 2 (two) times  daily. Patient not taking: Reported on 03/06/2019 06/12/18   Wallis Bamberg, PA-C    Allergies    Patient has no known allergies.  Review of Systems   Review of Systems  Constitutional: Negative for chills and fever.  HENT: Positive for congestion, postnasal drip, rhinorrhea, sinus pressure and sneezing. Negative for ear discharge, ear pain and sore throat.   Respiratory: Positive for cough. Negative for shortness of breath.   Cardiovascular: Positive for chest pain (with cough).  Gastrointestinal: Negative for abdominal pain, diarrhea, nausea and vomiting.   Physical Exam Updated Vital Signs BP 113/61   Pulse 74   Temp 98.4 F (36.9 C) (Oral)   Resp 18   Ht 5' 3.25" (1.607 m)   Wt 101.2 kg   LMP 02/09/2020   SpO2 99%   BMI 39.21 kg/m   Physical Exam Vitals and nursing note reviewed.  Constitutional:      General: She is not in acute distress.    Appearance: Normal appearance. She is not ill-appearing, toxic-appearing or diaphoretic.  HENT:     Head: Normocephalic and atraumatic.     Right Ear: Ear canal and external ear normal. There is impacted cerumen.     Left  Ear: Ear canal and external ear normal. There is impacted cerumen.     Nose: Congestion and rhinorrhea present.     Comments: Boggy nasal turbinates noted bilaterally.  No frontal or maxillary sinus tenderness.    Mouth/Throat:     Mouth: Mucous membranes are moist.     Pharynx: Oropharynx is clear. No oropharyngeal exudate or posterior oropharyngeal erythema.     Comments: Mild red streaking noted in the posterior oropharynx.  No significant erythema.  Uvula midline.  No tonsillar hypertrophy.  Readily handling secretions.  No exudates. Eyes:     Extraocular Movements: Extraocular movements intact.  Cardiovascular:     Rate and Rhythm: Normal rate and regular rhythm.     Pulses: Normal pulses.     Heart sounds: Normal heart sounds. No murmur heard.  No friction rub. No gallop.   Pulmonary:     Effort:  Pulmonary effort is normal. No respiratory distress.     Breath sounds: Normal breath sounds. No stridor. No wheezing, rhonchi or rales.  Abdominal:     General: Abdomen is flat.     Palpations: Abdomen is soft.     Tenderness: There is no abdominal tenderness.  Musculoskeletal:        General: Normal range of motion.     Cervical back: Normal range of motion and neck supple. No tenderness.  Lymphadenopathy:     Cervical: No cervical adenopathy.  Skin:    General: Skin is warm and dry.  Neurological:     General: No focal deficit present.     Mental Status: She is alert and oriented to person, place, and time.  Psychiatric:        Mood and Affect: Mood normal.        Behavior: Behavior normal.    ED Results / Procedures / Treatments   Labs (all labs ordered are listed, but only abnormal results are displayed) Labs Reviewed  RESPIRATORY PANEL BY RT PCR (FLU A&B, COVID)   EKG None  Radiology No results found.  Procedures Procedures (including critical care time)  Medications Ordered in ED Medications  acetaminophen (TYLENOL) tablet 650 mg (has no administration in time range)  ibuprofen (ADVIL) tablet 600 mg (has no administration in time range)    ED Course  I have reviewed the triage vital signs and the nursing notes.  Pertinent labs & imaging results that were available during my care of the patient were reviewed by me and considered in my medical decision making (see chart for details).  Clinical Course as of Mar 01 1647  Thu Feb 29, 2020  1637 SARS Coronavirus 2 by RT PCR: NEGATIVE [LJ]  1637 Influenza A By PCR: NEGATIVE [LJ]  1637 Influenza B By PCR: NEGATIVE [LJ]    Clinical Course User Index [LJ] Placido Sou, PA-C   MDM Rules/Calculators/A&P                          Patient is a 24 year old female who presents to the emergency department with what appears to be a viral URI.  She has been vaccinated for COVID-19.  Her respiratory panel was  negative today.  Physical exam was reassuring.  She does have some congestion, rhinorrhea, boggy nasal turbinates.  No frontal or maxillary sinus tenderness.  No significant erythema in the posterior oropharynx.  No cervical lymphadenopathy.  Lungs were clear to auscultation bilaterally and patient is not tachycardic.  She is afebrile and not hypoxic.  We discussed multiple over-the-counter medications for her symptoms.  Continued use of Tylenol and ibuprofen.  We discussed dosing.  She requests additional 800 mg ibuprofen.  I also gave her a prescription for a short course of Tessalon Perles.  She understands she can return to the ER if she develops any new or worsening symptoms.  Her questions were answered and she was amicable at the time of discharge.  Her vital signs are stable.  Final Clinical Impression(s) / ED Diagnoses Final diagnoses:  Viral URI with cough    Rx / DC Orders ED Discharge Orders         Ordered    ibuprofen (ADVIL) 800 MG tablet  Every 8 hours PRN        02/29/20 1643    benzonatate (TESSALON) 100 MG capsule  3 times daily PRN        02/29/20 1643           Placido Sou, PA-C 02/29/20 1648    Charlynne Pander, MD 02/29/20 2348

## 2020-02-29 NOTE — Discharge Instructions (Addendum)
I would recommend using Flonase twice per day in each nostril.  This will help with inflammation as well as congestion.  Also recommend using Tylenol and ibuprofen for management of your symptoms.  I will prescribe you additional 800 mg ibuprofen.  I am also going to prescribe you Tessalon Perles for your cough.  You can take these up to 3 times a day for your cough.  If you find that this cough medication does not help, you can also try Delsym.  This is an over-the-counter medication.  If your symptoms worsen you can always return to the ER for reevaluation.  It was a pleasure to meet you.

## 2020-10-11 ENCOUNTER — Emergency Department (HOSPITAL_BASED_OUTPATIENT_CLINIC_OR_DEPARTMENT_OTHER)
Admission: EM | Admit: 2020-10-11 | Discharge: 2020-10-11 | Disposition: A | Discharge status: Home / Self Care | Payer: BLUE CROSS/BLUE SHIELD | Attending: Emergency Medicine | Admitting: Emergency Medicine

## 2020-10-11 ENCOUNTER — Encounter (HOSPITAL_BASED_OUTPATIENT_CLINIC_OR_DEPARTMENT_OTHER): Payer: Self-pay | Admitting: *Deleted

## 2020-10-11 ENCOUNTER — Emergency Department (HOSPITAL_BASED_OUTPATIENT_CLINIC_OR_DEPARTMENT_OTHER): Payer: BLUE CROSS/BLUE SHIELD

## 2020-10-11 ENCOUNTER — Other Ambulatory Visit: Payer: Self-pay

## 2020-10-11 DIAGNOSIS — R0602 Shortness of breath: Secondary | ICD-10-CM | POA: Insufficient documentation

## 2020-10-11 DIAGNOSIS — J45909 Unspecified asthma, uncomplicated: Secondary | ICD-10-CM | POA: Diagnosis not present

## 2020-10-11 DIAGNOSIS — R059 Cough, unspecified: Secondary | ICD-10-CM | POA: Insufficient documentation

## 2020-10-11 DIAGNOSIS — R079 Chest pain, unspecified: Secondary | ICD-10-CM | POA: Insufficient documentation

## 2020-10-11 DIAGNOSIS — R0981 Nasal congestion: Secondary | ICD-10-CM | POA: Diagnosis not present

## 2020-10-11 DIAGNOSIS — Z20822 Contact with and (suspected) exposure to covid-19: Secondary | ICD-10-CM | POA: Diagnosis not present

## 2020-10-11 LAB — SARS CORONAVIRUS 2 (TAT 6-24 HRS): SARS Coronavirus 2: NEGATIVE

## 2020-10-11 MED ORDER — AEROCHAMBER PLUS FLO-VU MEDIUM MISC
1.0000 | Freq: Once | Status: AC
  Administered 2020-10-11: 1
  Filled 2020-10-11: qty 1

## 2020-10-11 MED ORDER — ALBUTEROL SULFATE HFA 108 (90 BASE) MCG/ACT IN AERS
2.0000 | INHALATION_SPRAY | Freq: Once | RESPIRATORY_TRACT | Status: AC
  Administered 2020-10-11: 2 via RESPIRATORY_TRACT
  Filled 2020-10-11: qty 6.7

## 2020-10-11 NOTE — ED Provider Notes (Signed)
[facility] EMERGENCY DEPARTMENT Provider Note   CSN: [id number] Arrival date & time: 10/11/20  1311     History No chief complaint on file.   Teresa Durham is a 25 y.o. female with a past medical history of PCOS, asthma, who presents today for evaluation of chest pain, shortness of breath after COVID exposure.  She states that she recently spent time with someone who the next day was diagnosed with COVID.  She states that she is vaccinated and boosters.  She states that she noted today that when she was talking she felt short of breath and was having abnormal sensations on the right side of her chest.  She says that this felt like it was a pulling feeling.  She denies any cough.  She does not feel short of breath with ambulation.  She states that she does have asthma, has not used an inhaler in about 6 years however feels like she could use 1 now. She denies any fevers.  She does report mild congestion. She states she did have a sinus infection about 2 weeks ago and has had a lingering cough since then which she attributes to postnasal drip.    HPI     Past Medical History:  Diagnosis Date  . Asthma   . PCOS (polycystic ovarian syndrome)     Patient Active Problem List   Diagnosis Date Noted  . Murmur 04/04/2013  . Chest pressure 04/04/2013  . Dyspnea 04/04/2013  . Asthma 04/04/2013  . Costochondritis 04/04/2013    Past Surgical History:  Procedure Laterality Date  . TONSILLECTOMY       OB History    Gravida  0   Para  0   Term  0   Preterm  0   AB  0   Living  0     SAB  0   IAB  0   Ectopic  0   Multiple  0   Live Births  0           No family history on file.  Social History   Tobacco Use  . Smoking status: Never Smoker  . Smokeless tobacco: Never Used  Vaping Use  . Vaping Use: Never used  Substance Use Topics  . Alcohol use: No  . Drug use: No    Home Medications Prior to Admission medications   Medication Sig  Start Date End Date Taking? Authorizing Provider  Norethindrone-Ethinyl Estradiol-Fe Biphas (LO LOESTRIN FE) 1 MG-10 MCG / 10 MCG tablet Take by mouth.   Yes [provider]  benzonatate (TESSALON) 100 MG capsule Take 1 capsule (100 mg total) by mouth 3 (three) times daily as needed for cough. 02/29/20   Placido Sou, PA-C  ibuprofen (ADVIL) 800 MG tablet Take 1 tablet (800 mg total) by mouth every 8 (eight) hours as needed. 02/29/20   Placido Sou, PA-C  KAITLIB FE 0.8-25 MG-MCG tablet CHEW AND SWALLOW 1 (ONE) TABLET CHEWABLE DAILY 01/17/19   [provider]  oseltamivir (TAMIFLU) 75 MG capsule Take 1 capsule (75 mg total) by mouth 2 (two) times daily. Patient not taking: No sig reported 06/12/18   Wallis Bamberg, PA-C    Allergies    Patient has no known allergies.  Review of Systems   Review of Systems  Constitutional: Positive for fatigue. Negative for chills and fever.  HENT: Negative for congestion.   Respiratory: Positive for cough and shortness of breath. Negative for chest tightness.   Cardiovascular:  Positive for chest pain. Negative for palpitations and leg swelling.  Genitourinary: Negative for dysuria.  Musculoskeletal: Positive for arthralgias and myalgias.  Skin: Negative for color change and rash.  Neurological: Negative for weakness and headaches.  Psychiatric/Behavioral: Negative for confusion.  All other systems reviewed and are negative.   Physical Exam Updated Vital Signs BP (!) 106/59 (BP Location: Left Arm)   Pulse 82   Temp 98.4 F (36.9 C) (Oral)   Resp 17   Ht 5' 3.25" (1.607 m)   Wt 104.8 kg   LMP 10/04/2020   SpO2 100%   BMI 40.60 kg/m   Physical Exam Vitals and nursing note reviewed.  Constitutional:      General: She is not in acute distress.    Appearance: She is not ill-appearing or diaphoretic.  HENT:     Head: Normocephalic and atraumatic.  Eyes:     General: No scleral icterus.       Right eye: No discharge.         Left eye: No discharge.     Conjunctiva/sclera: Conjunctivae normal.  Cardiovascular:     Rate and Rhythm: Normal rate and regular rhythm.     Pulses: Normal pulses.     Heart sounds: Normal heart sounds.  Pulmonary:     Effort: Pulmonary effort is normal. No respiratory distress.     Breath sounds: No stridor. No wheezing.  Abdominal:     General: There is no distension.     Tenderness: There is no abdominal tenderness. There is no guarding.  Musculoskeletal:        General: No deformity.     Cervical back: Normal range of motion and neck supple.     Right lower leg: No edema.     Left lower leg: No edema.     Comments: No obvious acute injury  Skin:    General: Skin is warm and dry.  Neurological:     General: No focal deficit present.     Mental Status: She is alert and oriented to person, place, and time. Mental status is at baseline.     Motor: No abnormal muscle tone.     Comments: Awake and alert, answers all questions appropriately.  Speech is not slurred.  Psychiatric:        Mood and Affect: Mood normal.        Behavior: Behavior normal.     ED Results / Procedures / Treatments   Labs (all labs ordered are listed, but only abnormal results are displayed) Labs Reviewed  SARS CORONAVIRUS 2 (TAT 6-24 HRS)    EKG EKG Interpretation  Date/Time:  Friday Oct 11 2020 14:43:46 EDT Ventricular Rate:  78 PR Interval:  159 QRS Duration: 87 QT Interval:  365 QTC Calculation: 416 R Axis:   63 Text Interpretation: Sinus rhythm Confirmed by Virgina Norfolk ([id number]) on 10/11/2020 3:10:48 PM   Radiology DG Chest Portable 1 View  Result Date: 10/11/2020 CLINICAL DATA:  Shortness of breath, COVID exposure EXAM: PORTABLE CHEST 1 VIEW COMPARISON:  2014 FINDINGS: The heart size and mediastinal contours are within normal limits. Both lungs are clear. No pleural effusion. The visualized skeletal structures are unremarkable. IMPRESSION: No acute process in the chest. Electronically  Signed   By: Guadlupe Spanish M.D.   On: 10/11/2020 15:00    Procedures Procedures   Medications Ordered in ED Medications  albuterol (VENTOLIN HFA) 108 (90 Base) MCG/ACT inhaler 2 puff (2 puffs Inhalation Given 10/11/20 1507)  AeroChamber  Plus Flo-Vu Medium MISC 1 each (1 each Other Given 10/11/20 1507)    ED Course  I have reviewed the triage vital signs and the nursing notes.  Pertinent labs & imaging results that were available during my care of the patient were reviewed by me and considered in my medical decision making (see chart for details).    MDM Rules/Calculators/A&P                         Teresa Durham was evaluated in Emergency Department on 10/12/2020 for the symptoms described in the history of present illness. She was evaluated in the context of the global COVID-19 pandemic, which necessitated consideration that the patient might be at risk for infection with the SARS-CoV-2 virus that causes COVID-19. Institutional protocols and algorithms that pertain to the evaluation of patients at risk for COVID-19 are in a state of rapid change based on information released by regulatory bodies including the CDC and federal and state organizations. These policies and algorithms were followed during the patient's care in the ED.  Patient is a 25 year old woman with a past medical history of asthma who presents today for evaluation of feeling short of breath when she talks, pulling feeling in the right side of her chest when she talks, increased congestion and mild myalgias after a known COVID close exposure.  She is able to ambulate at bedside, maintains 99 to 100% on room air without significant dyspnea or symptoms. She is vaccinated and boosters. Given that she does not have a positive COVID test at this time she does not qualify for oral antivirals.  She is on day 2 of symptoms. COVID test is ordered.   Chest x-ray is obtained, especially given that she had persistent cough after sinus  infection 2 weeks ago showing No acute abnormalities.   Given her abnormal feelings in her chest and shortness of breath EKG is obtained showing No ischemia.   I discussed conservative treatment with patient who states her understanding.  We discussed quarantine/isolation protocols.  Return precautions were discussed with patient who states their understanding.  At the time of discharge patient denied any unaddressed complaints or concerns.  Patient is agreeable for discharge home.  Note: Portions of this report may have been transcribed using voice recognition software. Every effort was made to ensure accuracy; however, inadvertent computerized transcription errors may be present  Final Clinical Impression(s) / ED Diagnoses Final diagnoses:  Suspected COVID-19 virus infection  Exposure to confirmed case of COVID-19    Rx / DC Orders ED Discharge Orders    None       Cristina Gong, PA-C 10/12/20 0011    Alvira Monday, MD 10/15/20 1436

## 2020-10-11 NOTE — Discharge Instructions (Signed)
Today your chest x-ray and heart tracing (EKG) were reassuring.    Please follow CDC COVID isolation precautions.   Typically this involves isolating at home for 5 days of symptoms.  After this from days 5-10 you can be out however you must wear a tight well fitting good-quality mask, and avoid contact with anyone who would be considered high risk if they got COVID.  You can find more information about this on the [facility] website.  You may take Tylenol and ibuprofen as indicated below.  Please try treating with Tylenol, if that is not controlling any symptoms then you may add ibuprofen as needed.  Please take Ibuprofen (Advil, motrin) and Tylenol (acetaminophen) to relieve your pain.    You may take up to 600 MG (3 pills) of normal strength ibuprofen every 8 hours as needed.   You make take tylenol, up to 1,000 mg (two extra strength pills) every 8 hours as needed.   It is safe to take ibuprofen and tylenol at the same time as they work differently.   Do not take more than 3,000 mg tylenol in a 24 hour period (not more than one dose every 8 hours.  Please check all medication labels as many medications such as pain and cold medications may contain tylenol.  Do not drink alcohol while taking these medications.  Do not take other NSAID'S while taking ibuprofen (such as aleve or naproxen).  Please take ibuprofen with food to decrease stomach upset.  IF your chest pain worsens, you develop constant shortness of breath, your oxygen is under 92%, you have any new concerns please seek additional medical care and evaluation.

## 2020-10-11 NOTE — ED Triage Notes (Signed)
Covid exposure yesterday. She had a sinus infection 2 weeks ago. Last night she woke with sinus drainage. At work today her back was hurting and it was hard for the talk due to SOB.

## 2021-11-24 ENCOUNTER — Encounter (HOSPITAL_BASED_OUTPATIENT_CLINIC_OR_DEPARTMENT_OTHER): Payer: Self-pay | Admitting: Emergency Medicine

## 2021-11-24 ENCOUNTER — Emergency Department (HOSPITAL_BASED_OUTPATIENT_CLINIC_OR_DEPARTMENT_OTHER): Payer: Self-pay

## 2021-11-24 ENCOUNTER — Emergency Department (HOSPITAL_BASED_OUTPATIENT_CLINIC_OR_DEPARTMENT_OTHER)
Admission: EM | Admit: 2021-11-24 | Discharge: 2021-11-25 | Disposition: A | Discharge status: Home / Self Care | Payer: Self-pay | Attending: Emergency Medicine | Admitting: Emergency Medicine

## 2021-11-24 ENCOUNTER — Other Ambulatory Visit: Payer: Self-pay

## 2021-11-24 DIAGNOSIS — R110 Nausea: Secondary | ICD-10-CM | POA: Insufficient documentation

## 2021-11-24 DIAGNOSIS — R1084 Generalized abdominal pain: Secondary | ICD-10-CM | POA: Insufficient documentation

## 2021-11-24 DIAGNOSIS — R0789 Other chest pain: Secondary | ICD-10-CM | POA: Insufficient documentation

## 2021-11-24 LAB — CBC
HCT: 37.4 % (ref 36.0–46.0)
Hemoglobin: 12.5 g/dL (ref 12.0–15.0)
MCH: 30.5 pg (ref 26.0–34.0)
MCHC: 33.4 g/dL (ref 30.0–36.0)
MCV: 91.2 fL (ref 80.0–100.0)
Platelets: 355 10*3/uL (ref 150–400)
RBC: 4.1 MIL/uL (ref 3.87–5.11)
RDW: 11.9 % (ref 11.5–15.5)
WBC: 12 10*3/uL — ABNORMAL HIGH (ref 4.0–10.5)
nRBC: 0 % (ref 0.0–0.2)

## 2021-11-24 LAB — URINALYSIS, ROUTINE W REFLEX MICROSCOPIC
Bilirubin Urine: NEGATIVE
Glucose, UA: NEGATIVE mg/dL
Hgb urine dipstick: NEGATIVE
Ketones, ur: NEGATIVE mg/dL
Leukocytes,Ua: NEGATIVE
Nitrite: NEGATIVE
Protein, ur: NEGATIVE mg/dL
Specific Gravity, Urine: 1.025 (ref 1.005–1.030)
pH: 6 (ref 5.0–8.0)

## 2021-11-24 LAB — LIPASE, BLOOD: Lipase: 33 U/L (ref 11–51)

## 2021-11-24 LAB — COMPREHENSIVE METABOLIC PANEL
ALT: 88 U/L — ABNORMAL HIGH (ref 0–44)
AST: 44 U/L — ABNORMAL HIGH (ref 15–41)
Albumin: 3.8 g/dL (ref 3.5–5.0)
Alkaline Phosphatase: 73 U/L (ref 38–126)
Anion gap: 8 (ref 5–15)
BUN: 9 mg/dL (ref 6–20)
CO2: 22 mmol/L (ref 22–32)
Calcium: 9 mg/dL (ref 8.9–10.3)
Chloride: 107 mmol/L (ref 98–111)
Creatinine, Ser: 0.75 mg/dL (ref 0.44–1.00)
GFR, Estimated: >60 mL/min (ref >60)
Glucose, Bld: 104 mg/dL — ABNORMAL HIGH (ref 70–99)
Potassium: 3.9 mmol/L (ref 3.5–5.1)
Sodium: 137 mmol/L (ref 135–145)
Total Bilirubin: 0.3 mg/dL (ref 0.3–1.2)
Total Protein: 7.7 g/dL (ref 6.5–8.1)

## 2021-11-24 LAB — PREGNANCY, URINE: Preg Test, Ur: NEGATIVE

## 2021-11-24 NOTE — ED Triage Notes (Signed)
Patient c/o abdominal discomfort for the last 3 weeks with diarrhea, only vomited x1.

## 2021-11-25 LAB — HEPATITIS PANEL, ACUTE
HCV Ab: NONREACTIVE
Hep A IgM: NONREACTIVE
Hep B C IgM: NONREACTIVE
Hepatitis B Surface Ag: NONREACTIVE

## 2021-11-25 MED ORDER — DICYCLOMINE HCL 10 MG PO CAPS
10.0000 mg | ORAL_CAPSULE | Freq: Once | ORAL | Status: AC
  Administered 2021-11-25: 10 mg via ORAL
  Filled 2021-11-25: qty 1

## 2021-11-25 MED ORDER — DICYCLOMINE HCL 20 MG PO TABS: 20.0000 mg | ORAL_TABLET | Freq: Two times a day (BID) | ORAL | 0 refills | Status: DC

## 2021-11-25 MED ORDER — ONDANSETRON 4 MG PO TBDP: ORAL_TABLET | ORAL | 0 refills | Status: DC

## 2021-11-25 MED ORDER — ONDANSETRON 4 MG PO TBDP
8.0000 mg | ORAL_TABLET | Freq: Once | ORAL | Status: AC
  Administered 2021-11-25: 8 mg via ORAL
  Filled 2021-11-25: qty 2

## 2021-11-25 MED ORDER — PANTOPRAZOLE SODIUM 40 MG PO TBEC
80.0000 mg | DELAYED_RELEASE_TABLET | Freq: Every day | ORAL | Status: DC
  Filled 2021-11-25: qty 2

## 2021-11-25 MED ORDER — PANTOPRAZOLE SODIUM 40 MG PO TBEC: 80.0000 mg | DELAYED_RELEASE_TABLET | Freq: Every day | ORAL | 0 refills | Status: DC

## 2021-11-25 MED ORDER — PANTOPRAZOLE SODIUM 40 MG PO TBEC
80.0000 mg | DELAYED_RELEASE_TABLET | Freq: Once | ORAL | Status: AC
  Administered 2021-11-25: 80 mg via ORAL

## 2021-11-25 NOTE — ED Provider Notes (Signed)
[facility] EMERGENCY DEPARTMENT Provider Note   CSN: [id number] Arrival date & time: 11/24/21  2139     History  Chief Complaint  Patient presents with   Abdominal Pain    Teresa Durham is a 26 y.o. female.  26 year old female presents the ER today with 3 weeks of symptoms.  Sounds like patient has symptoms worse in the morning and after eating she will have the feeling of bloating, nausea, epigastric, chest and abdominal discomfort.  She has a low level of all these things throughout the day but they are definitely worse at these times.  States she tried moving things at home mostly around gastritis medicines like Tums, maalox, rolaid, PPI without improvement.  She states that she is try to change her diet to see if there are certain foods that make it better or worse and none do.  She states that water seems to be making it worse.  She does not really have abdominal pain she states is more of a discomfort like to get anyone to watch her stomach and eat any food.  She cannot describe any other way.  She had these issues in the past but not for 3 weeks.  She also nonbloody diarrhea.  This seems to ebb and flow.  No recent illnesses, antibiotics or other sick contacts.   Abdominal Pain      Home Medications Prior to Admission medications   Medication Sig Start Date End Date Taking? Authorizing Provider  dicyclomine (BENTYL) 20 MG tablet Take 1 tablet (20 mg total) by mouth in the morning and at bedtime. 11/25/21  Yes Sheily Lineman, Barbara Cower, MD  ondansetron (ZOFRAN-ODT) 4 MG disintegrating tablet 4mg  ODT q4 hours prn nausea/vomit 11/25/21  Yes Pallie Swigert, 01/26/22, MD  pantoprazole (PROTONIX) 40 MG tablet Take 2 tablets (80 mg total) by mouth daily for 10 days. 11/25/21 12/05/21 Yes Ericha Whittingham, 12/07/21, MD  benzonatate (TESSALON) 100 MG capsule Take 1 capsule (100 mg total) by mouth 3 (three) times daily as needed for cough. 02/29/20   03/02/20, PA-C  ibuprofen (ADVIL) 800 MG tablet Take 1  tablet (800 mg total) by mouth every 8 (eight) hours as needed. 02/29/20   03/02/20, PA-C  KAITLIB FE 0.8-25 MG-MCG tablet CHEW AND SWALLOW 1 (ONE) TABLET CHEWABLE DAILY 01/17/19   [provider]  Norethindrone-Ethinyl Estradiol-Fe Biphas (LO LOESTRIN FE) 1 MG-10 MCG / 10 MCG tablet Take by mouth.    [provider]  oseltamivir (TAMIFLU) 75 MG capsule Take 1 capsule (75 mg total) by mouth 2 (two) times daily. Patient not taking: No sig reported 06/12/18   06/14/18, PA-C      Allergies    Patient has no known allergies.    Review of Systems   Review of Systems  Gastrointestinal:  Positive for abdominal pain.    Physical Exam Updated Vital Signs BP 115/67   Pulse 70   Temp 98.5 F (36.9 C) (Oral)   Resp 16   Ht 5\' 3"  (1.6 m)   Wt 108 kg   LMP 11/10/2021   SpO2 100%   BMI 42.16 kg/m  Physical Exam Vitals and nursing note reviewed.  Constitutional:      Appearance: She is well-developed.  HENT:     Head: Normocephalic and atraumatic.  Cardiovascular:     Rate and Rhythm: Normal rate and regular rhythm.  Pulmonary:     Effort: No respiratory distress.     Breath sounds: No stridor.  Abdominal:  General: Bowel sounds are normal. There is no distension.     Palpations: Abdomen is soft.  Musculoskeletal:     Cervical back: Normal range of motion.  Neurological:     Mental Status: She is alert.     ED Results / Procedures / Treatments   Labs (all labs ordered are listed, but only abnormal results are displayed) Labs Reviewed  COMPREHENSIVE METABOLIC PANEL - Abnormal; Notable for the following components:      Result Value   Glucose, Bld 104 (*)    AST 44 (*)    ALT 88 (*)    All other components within normal limits  CBC - Abnormal; Notable for the following components:   WBC 12.0 (*)    All other components within normal limits  LIPASE, BLOOD  URINALYSIS, ROUTINE W REFLEX MICROSCOPIC  PREGNANCY, URINE  HEPATITIS PANEL, ACUTE     EKG None  Radiology US Abdomen Limited RUQ (LIVER/GB)  Result Date: 11/25/2021 CLINICAL DATA:  Abdominal pain EXAM: ULTRASOUND ABDOMEN LIMITED RIGHT UPPER QUADRANT COMPARISON:  None Available. FINDINGS: Gallbladder: Gallbladder is contracted.  No visible stones or wall thickening. Common bile duct: Diameter: Normal caliber, 3 mm Liver: 1.4 cm cyst in the right hepatic lobe. Normal echotexture. No biliary duct dilatation. Portal vein is patent on color Doppler imaging with normal direction of blood flow towards the liver. Other: None. IMPRESSION: No acute findings. No evidence of cholelithiasis or acute cholecystitis. Electronically Signed   By: Charlett Nose M.D.   On: 11/25/2021 01:14    Procedures Procedures    Medications Ordered in ED Medications  ondansetron (ZOFRAN-ODT) disintegrating tablet 8 mg (8 mg Oral Given 11/25/21 0133)  dicyclomine (BENTYL) capsule 10 mg (10 mg Oral Given 11/25/21 0134)  pantoprazole (PROTONIX) EC tablet 80 mg (80 mg Oral Given 11/25/21 0133)    ED Course/ Medical Decision Making/ A&P                           Medical Decision Making Amount and/or Complexity of Data Reviewed Labs: ordered. Radiology: ordered.  Risk Prescription drug management.   Suspect enteritis vs gastritis related to GERD. Will increase her fiber intake along with probioitics and treatment for gastritis. Fu w/ U/C or PCP if able if not improving.   Final Clinical Impression(s) / ED Diagnoses Final diagnoses:  Generalized abdominal pain    Rx / DC Orders ED Discharge Orders          Ordered    ondansetron (ZOFRAN-ODT) 4 MG disintegrating tablet        11/25/21 0256    dicyclomine (BENTYL) 20 MG tablet  2 times daily        11/25/21 0256    pantoprazole (PROTONIX) 40 MG tablet  Daily        11/25/21 0256              Zayne Draheim, Barbara Cower, MD 11/25/21 [phone]

## 2022-08-12 ENCOUNTER — Ambulatory Visit (INDEPENDENT_AMBULATORY_CARE_PROVIDER_SITE_OTHER): Payer: BC Managed Care – PPO

## 2022-08-12 ENCOUNTER — Encounter: Payer: Self-pay | Admitting: Podiatry

## 2022-08-12 ENCOUNTER — Ambulatory Visit: Payer: BC Managed Care – PPO | Admitting: Podiatry

## 2022-08-12 DIAGNOSIS — M7752 Other enthesopathy of left foot: Secondary | ICD-10-CM | POA: Diagnosis not present

## 2022-08-12 DIAGNOSIS — M722 Plantar fascial fibromatosis: Secondary | ICD-10-CM

## 2022-08-12 DIAGNOSIS — M7750 Other enthesopathy of unspecified foot: Secondary | ICD-10-CM

## 2022-08-12 MED ORDER — IBUPROFEN 800 MG PO TABS: 800.0000 mg | ORAL_TABLET | Freq: Three times a day (TID) | ORAL | 0 refills | Status: DC | PRN

## 2022-08-12 NOTE — Progress Notes (Signed)
  Subjective:  Patient ID: Teresa Durham, female    DOB: May 29, 1995,  MRN: [id number]  Chief Complaint  Patient presents with   Foot Pain    NP- Pain in ankle and bottom of L foot    27 y.o. female presents with the above complaint. History confirmed with patient.  She presents with a new issue on the bottom of the left foot and heel, has had quite a bit of pain here.  Worse with weightbearing.  Objective:  Physical Exam: warm, good capillary refill, no trophic changes or ulcerative lesions, normal DP and PT pulses, normal sensory exam, and pain on palpation to plantar left heel plan  Radiographs: Multiple views x-ray of the left foot: No spur noted Assessment:   1. Plantar fasciitis, left      Plan:  Patient was evaluated and treated and all questions answered.  Discussed the etiology and treatment options for plantar fasciitis including stretching, formal physical therapy, supportive shoegears such as a running shoe or sneaker, pre fabricated orthoses, injection therapy, and oral medications. We also discussed the role of surgical treatment of this for patients who do not improve after exhausting non-surgical treatment options.   -XR reviewed with patient -Educated patient on stretching and icing of the affected limb -Injection delivered to the plantar fascia of the left foot. -Rx for ibuprofen 800 mg sent to pharmacy does work well for her before, previously she had gastric reflux issues with it I advised her to take Pepcid or Prilosec OTC along with this to mitigate this.  Use only as needed  After sterile prep with povidone-iodine solution and alcohol, the left heel was injected with 0.5cc 2% xylocaine plain, 0.5cc 0.5% marcaine plain, 5mg  triamcinolone acetonide, and 2mg  dexamethasone was injected along the medial plantar fascia at the insertion on the plantar calcaneus. The patient tolerated the procedure well without complication.    Return if symptoms worsen or fail to  improve.

## 2022-08-12 NOTE — Patient Instructions (Signed)

## 2023-07-21 ENCOUNTER — Other Ambulatory Visit: Payer: Self-pay

## 2023-07-21 ENCOUNTER — Ambulatory Visit
Admission: EM | Admit: 2023-07-21 | Discharge: 2023-07-21 | Disposition: A | Discharge status: Home / Self Care | Attending: Emergency Medicine | Admitting: Emergency Medicine

## 2023-07-21 ENCOUNTER — Telehealth: Payer: Self-pay | Admitting: Emergency Medicine

## 2023-07-21 ENCOUNTER — Encounter: Payer: Self-pay | Admitting: Emergency Medicine

## 2023-07-21 DIAGNOSIS — J4521 Mild intermittent asthma with (acute) exacerbation: Secondary | ICD-10-CM | POA: Diagnosis not present

## 2023-07-21 MED ORDER — ALBUTEROL SULFATE HFA 108 (90 BASE) MCG/ACT IN AERS: 2.0000 | INHALATION_SPRAY | Freq: Four times a day (QID) | RESPIRATORY_TRACT | 2 refills | Status: DC | PRN

## 2023-07-21 MED ORDER — METHYLPREDNISOLONE ACETATE 80 MG/ML IJ SUSP
60.0000 mg | Freq: Once | INTRAMUSCULAR | Status: AC
  Administered 2023-07-21: 60 mg via INTRAMUSCULAR

## 2023-07-21 MED ORDER — AMOXICILLIN-POT CLAVULANATE 875-125 MG PO TABS: 1.0000 | ORAL_TABLET | Freq: Two times a day (BID) | ORAL | 0 refills | Status: DC

## 2023-07-21 MED ORDER — MONTELUKAST SODIUM 10 MG PO TABS: 10.0000 mg | ORAL_TABLET | Freq: Every day | ORAL | 2 refills | Status: DC

## 2023-07-21 MED ORDER — PREDNISONE 50 MG PO TABS: 50.0000 mg | ORAL_TABLET | Freq: Every day | ORAL | 0 refills | Status: AC

## 2023-07-21 MED ORDER — FLUCONAZOLE 150 MG PO TABS: 150.0000 mg | ORAL_TABLET | Freq: Every day | ORAL | 0 refills | Status: AC

## 2023-07-21 NOTE — Discharge Instructions (Signed)
 Begin Augmentin every morning and every evening for 7 days for bacterial coverage, may use Diflucan as needed if you begin to have vaginal irritation or itching  you have been given an injection injection of steroids to help open and relax, starting tomorrow take oral prednisone as directed  Begin montelukast every night before bed to help reduce asthmatic flares, has been refilled for 3 months  Inhaler Has been refilled    You can take Tylenol and/or Ibuprofen as needed for fever reduction and pain relief.   For cough: honey 1/2 to 1 teaspoon (you can dilute the honey in water or another fluid).  You can also use guaifenesin and dextromethorphan for cough. You can use a humidifier for chest congestion and cough.  If you don't have a humidifier, you can sit in the bathroom with the hot shower running.      For sore throat: try warm salt water gargles, cepacol lozenges, throat spray, warm tea or water with lemon/honey, popsicles or ice, or OTC cold relief medicine for throat discomfort.   For congestion: take a daily anti-histamine like Zyrtec, Claritin, and a oral decongestant, such as pseudoephedrine.  You can also use Flonase 1-2 sprays in each nostril daily.   It is important to stay hydrated: drink plenty of fluids (water, gatorade/powerade/pedialyte, juices, or teas) to keep your throat moisturized and help further relieve irritation/discomfort.

## 2023-07-21 NOTE — Telephone Encounter (Signed)
 Patient called to follow up, requesting nausea medicine due to having some nausea with Amoxicillin in the past.  Per Hansel Starling, APP okay to take Promethazine Cough Syrup  that patient has at home to support nausea during first few days.

## 2023-07-21 NOTE — ED Provider Notes (Signed)
 Renaldo Fiddler    CSN: [id number] Arrival date & time: 07/21/23  1232      History   Chief Complaint Chief Complaint  Patient presents with   Wheezing         HPI Teresa Durham is a 28 y.o. female.   Patient presents for evaluation of nasal congestion, rhinorrhea, nonproductive cough, sore throat shortness of breath with exertion and wheezing present for 6 days.  History of asthma, has attempted use of inhaler which has been ineffective, has taken 2 days of steroid course prescribed prior by PCP with no improvement.  No known sick contacts.  Appetite has improved since symptoms began.    Past Medical History:  Diagnosis Date   Asthma    PCOS (polycystic ovarian syndrome)     Patient Active Problem List   Diagnosis Date Noted   Migraine without aura and without status migrainosus, not intractable 06/23/2019   Murmur 04/04/2013   Chest pressure 04/04/2013   Dyspnea 04/04/2013   Asthma 04/04/2013   Costochondritis 04/04/2013    Past Surgical History:  Procedure Laterality Date   TONSILLECTOMY      OB History     Gravida  0   Para  0   Term  0   Preterm  0   AB  0   Living  0      SAB  0   IAB  0   Ectopic  0   Multiple  0   Live Births  0            Home Medications    Prior to Admission medications   Medication Sig Start Date End Date Taking? Authorizing Provider  albuterol (VENTOLIN HFA) 108 (90 Base) MCG/ACT inhaler Inhale 2 puffs into the lungs every 6 (six) hours as needed for wheezing or shortness of breath. 07/21/23  Yes Randy Castrejon, Elita Boone, NP  amoxicillin-clavulanate (AUGMENTIN) 875-125 MG tablet Take 1 tablet by mouth every 12 (twelve) hours. 07/21/23  Yes Joshue Badal R, NP  fluconazole (DIFLUCAN) 150 MG tablet Take 1 tablet (150 mg total) by mouth daily for 2 doses. 07/21/23 07/23/23 Yes Adebayo Ensminger R, NP  montelukast (SINGULAIR) 10 MG tablet Take 1 tablet (10 mg total) by mouth at bedtime. 07/21/23 10/19/23 Yes Boleslaw Borghi,  Zaylon Bossier R, NP  predniSONE (DELTASONE) 50 MG tablet Take 1 tablet (50 mg total) by mouth daily for 5 days. 07/21/23 07/26/23 Yes Faron Whitelock, Elita Boone, NP  amitriptyline (ELAVIL) 25 MG tablet Take by mouth. 07/08/20   [provider]  benzonatate (TESSALON) 100 MG capsule Take 1 capsule (100 mg total) by mouth 3 (three) times daily as needed for cough. 02/29/20   Placido Sou, PA-C  dicyclomine (BENTYL) 20 MG tablet Take 1 tablet (20 mg total) by mouth in the morning and at bedtime. 11/25/21   Mesner, Barbara Cower, MD  fluticasone (FLONASE) 50 MCG/ACT nasal spray Place 1 spray into both nostrils 2 (two) times daily.    [provider]  ibuprofen (ADVIL) 800 MG tablet Take 1 tablet (800 mg total) by mouth every 8 (eight) hours as needed. 08/12/22   McDonald, Adam R, DPM  KAITLIB FE 0.8-25 MG-MCG tablet CHEW AND SWALLOW 1 (ONE) TABLET CHEWABLE DAILY 01/17/19   [provider]  Norethindrone-Ethinyl Estradiol-Fe Biphas (LO LOESTRIN FE) 1 MG-10 MCG / 10 MCG tablet Take by mouth.    [provider]  ondansetron (ZOFRAN-ODT) 4 MG disintegrating tablet 4mg  ODT q4 hours prn nausea/vomit 11/25/21  Mesner, Barbara Cower, MD  oseltamivir (TAMIFLU) 75 MG capsule Take 1 capsule (75 mg total) by mouth 2 (two) times daily. Patient not taking: Reported on 03/06/2019 06/12/18   Wallis Bamberg, PA-C  pantoprazole (PROTONIX) 40 MG tablet Take 2 tablets (80 mg total) by mouth daily for 10 days. 11/25/21 12/05/21  Mesner, Barbara Cower, MD    Family History History reviewed. No pertinent family history.  Social History Social History   Tobacco Use   Smoking status: Never   Smokeless tobacco: Never  Vaping Use   Vaping status: Never Used  Substance Use Topics   Alcohol use: No   Drug use: No     Allergies   Sumatriptan   Review of Systems Review of Systems   Physical Exam Triage Vital Signs ED Triage Vitals  Encounter Vitals Group     BP 07/21/23 1321 114/75     Systolic BP Percentile --       Diastolic BP Percentile --      Pulse Rate 07/21/23 1321 76     Resp 07/21/23 1321 18     Temp 07/21/23 1321 97.8 F (36.6 C)     Temp Source 07/21/23 1321 Temporal     SpO2 07/21/23 1321 96 %     Weight --      Height --      Head Circumference --      Peak Flow --      Pain Score 07/21/23 1322 0     Pain Loc --      Pain Education --      Exclude from Growth Chart --    No data found.  Updated Vital Signs BP 114/75 (BP Location: Right Arm)   Pulse 76   Temp 97.8 F (36.6 C) (Temporal)   Resp 18   LMP 07/01/2023   SpO2 96%   Visual Acuity Right Eye Distance:   Left Eye Distance:   Bilateral Distance:    Right Eye Near:   Left Eye Near:    Bilateral Near:     Physical Exam Constitutional:      Appearance: Normal appearance.  HENT:     Head: Normocephalic.     Right Ear: Tympanic membrane, ear canal and external ear normal.     Left Ear: Tympanic membrane, ear canal and external ear normal.     Nose: Congestion present. No rhinorrhea.     Mouth/Throat:     Pharynx: No oropharyngeal exudate or posterior oropharyngeal erythema.  Eyes:     Extraocular Movements: Extraocular movements intact.  Cardiovascular:     Rate and Rhythm: Normal rate and regular rhythm.     Pulses: Normal pulses.     Heart sounds: Normal heart sounds.  Pulmonary:     Effort: Pulmonary effort is normal.     Breath sounds: Wheezing present.  Musculoskeletal:     Cervical back: Normal range of motion and neck supple.  Neurological:     Mental Status: She is alert and oriented to person, place, and time. Mental status is at baseline.      UC Treatments / Results  Labs (all labs ordered are listed, but only abnormal results are displayed) Labs Reviewed - No data to display  EKG   Radiology No results found.  Procedures Procedures (including critical care time)  Medications Ordered in UC Medications  methylPREDNISolone acetate (DEPO-MEDROL) injection 60 mg (60 mg  Intramuscular Given 07/21/23 1349)    Initial Impression / Assessment and Plan / UC Course  I have reviewed the triage vital signs and the nursing notes.  Pertinent labs & imaging results that were available during my care of the patient were reviewed by me and considered in my medical decision making (see chart for details).  Mild intermittent wheezing with acute exacerbation  Patient is in no signs of distress nor toxic appearing.  Vital signs are stable.  Low suspicion for pneumonia, pneumothorax or bronchitis and therefore will defer imaging.  Prescribed Augmentin as symptoms present for 7 days will provide bacterial coverage, given methylprednisolone injection and discontinue taper for prednisone burst.May use additional over-the-counter medications as needed for supportive care.  May follow-up with urgent care as needed if symptoms persist or worsen.     Final Clinical Impressions(s) / UC Diagnoses   Final diagnoses:  Mild intermittent asthma with acute exacerbation     Discharge Instructions      Begin Augmentin every morning and every evening for 7 days for bacterial coverage, may use Diflucan as needed if you begin to have vaginal irritation or itching  you have been given an injection injection of steroids to help open and relax, starting tomorrow take oral prednisone as directed  Begin montelukast every night before bed to help reduce asthmatic flares, has been refilled for 3 months  Inhaler Has been refilled    You can take Tylenol and/or Ibuprofen as needed for fever reduction and pain relief.   For cough: honey 1/2 to 1 teaspoon (you can dilute the honey in water or another fluid).  You can also use guaifenesin and dextromethorphan for cough. You can use a humidifier for chest congestion and cough.  If you don't have a humidifier, you can sit in the bathroom with the hot shower running.      For sore throat: try warm salt water gargles, cepacol lozenges, throat spray,  warm tea or water with lemon/honey, popsicles or ice, or OTC cold relief medicine for throat discomfort.   For congestion: take a daily anti-histamine like Zyrtec, Claritin, and a oral decongestant, such as pseudoephedrine.  You can also use Flonase 1-2 sprays in each nostril daily.   It is important to stay hydrated: drink plenty of fluids (water, gatorade/powerade/pedialyte, juices, or teas) to keep your throat moisturized and help further relieve irritation/discomfort.    ED Prescriptions     Medication Sig Dispense Auth. Provider   predniSONE (DELTASONE) 50 MG tablet Take 1 tablet (50 mg total) by mouth daily for 5 days. 5 tablet Salli Quarry R, NP   amoxicillin-clavulanate (AUGMENTIN) 875-125 MG tablet Take 1 tablet by mouth every 12 (twelve) hours. 14 tablet Kareemah Grounds R, NP   fluconazole (DIFLUCAN) 150 MG tablet Take 1 tablet (150 mg total) by mouth daily for 2 doses. 2 tablet Jeffory Snelgrove R, NP   albuterol (VENTOLIN HFA) 108 (90 Base) MCG/ACT inhaler Inhale 2 puffs into the lungs every 6 (six) hours as needed for wheezing or shortness of breath. 8 g Nathyn Luiz R, NP   montelukast (SINGULAIR) 10 MG tablet Take 1 tablet (10 mg total) by mouth at bedtime. 30 tablet Valinda Hoar, NP      PDMP not reviewed this encounter.   Valinda Hoar, NP 07/21/23 1416

## 2023-07-21 NOTE — ED Triage Notes (Signed)
 Patient presents to [facility] for evaluation of cough, congstion, sore throat since Thursday of last week.  Over the weekend she began to feel worse and Monday she started a pack of Prednisone that she had at home.  She states she has still had wheezing since then (using Albuterol inhaler at home as well and today it won't go away, even with albuterol.  Patient did not take prednisone this morning.  Patient states she has a party/trip this weekend and "needs to be better".

## 2023-07-22 ENCOUNTER — Ambulatory Visit: Payer: Self-pay

## 2023-09-15 ENCOUNTER — Ambulatory Visit (INDEPENDENT_AMBULATORY_CARE_PROVIDER_SITE_OTHER): Admitting: Student in an Organized Health Care Education/Training Program

## 2023-09-15 ENCOUNTER — Encounter: Payer: Self-pay | Admitting: Student in an Organized Health Care Education/Training Program

## 2023-09-15 ENCOUNTER — Other Ambulatory Visit (HOSPITAL_COMMUNITY)
Admission: RE | Admit: 2023-09-15 | Discharge: 2023-09-15 | Disposition: A | Discharge status: Home / Self Care | Source: Ambulatory Visit | Attending: Student in an Organized Health Care Education/Training Program | Admitting: Student in an Organized Health Care Education/Training Program

## 2023-09-15 ENCOUNTER — Ambulatory Visit: Payer: Self-pay

## 2023-09-15 VITALS — BP 98/59 | HR 80 | Ht 63.0 in | Wt 221.0 lb

## 2023-09-15 DIAGNOSIS — Z1159 Encounter for screening for other viral diseases: Secondary | ICD-10-CM | POA: Diagnosis not present

## 2023-09-15 DIAGNOSIS — E669 Obesity, unspecified: Secondary | ICD-10-CM | POA: Insufficient documentation

## 2023-09-15 DIAGNOSIS — Z113 Encounter for screening for infections with a predominantly sexual mode of transmission: Secondary | ICD-10-CM | POA: Diagnosis present

## 2023-09-15 DIAGNOSIS — Z309 Encounter for contraceptive management, unspecified: Secondary | ICD-10-CM

## 2023-09-15 DIAGNOSIS — E282 Polycystic ovarian syndrome: Secondary | ICD-10-CM | POA: Diagnosis not present

## 2023-09-15 DIAGNOSIS — R110 Nausea: Secondary | ICD-10-CM | POA: Diagnosis not present

## 2023-09-15 DIAGNOSIS — Z114 Encounter for screening for human immunodeficiency virus [HIV]: Secondary | ICD-10-CM | POA: Diagnosis not present

## 2023-09-15 DIAGNOSIS — J301 Allergic rhinitis due to pollen: Secondary | ICD-10-CM

## 2023-09-15 DIAGNOSIS — J4520 Mild intermittent asthma, uncomplicated: Secondary | ICD-10-CM

## 2023-09-15 DIAGNOSIS — D500 Iron deficiency anemia secondary to blood loss (chronic): Secondary | ICD-10-CM

## 2023-09-15 DIAGNOSIS — D509 Iron deficiency anemia, unspecified: Secondary | ICD-10-CM | POA: Insufficient documentation

## 2023-09-15 DIAGNOSIS — J309 Allergic rhinitis, unspecified: Secondary | ICD-10-CM | POA: Insufficient documentation

## 2023-09-15 LAB — POCT GLYCOSYLATED HEMOGLOBIN (HGB A1C): Hemoglobin A1C: 5 % (ref 4.0–5.6)

## 2023-09-15 LAB — CBC
HCT: 37.1 % (ref 36.0–46.0)
Hemoglobin: 12.3 g/dL (ref 12.0–15.0)
MCHC: 33.1 g/dL (ref 30.0–36.0)
MCV: 93.8 fl (ref 78.0–100.0)
Platelets: 318 10*3/uL (ref 150.0–400.0)
RBC: 3.96 Mil/uL (ref 3.87–5.11)
RDW: 13 % (ref 11.5–15.5)
WBC: 7.7 10*3/uL (ref 4.0–10.5)

## 2023-09-15 LAB — LIPID PANEL
Cholesterol: 110 mg/dL (ref 0–200)
HDL: 53.8 mg/dL (ref >39.00)
LDL Cholesterol: 46 mg/dL (ref 0–99)
NonHDL: 56.05
Total CHOL/HDL Ratio: 2
Triglycerides: 49 mg/dL (ref 0.0–149.0)
VLDL: 9.8 mg/dL (ref 0.0–40.0)

## 2023-09-15 LAB — TSH: TSH: 1.01 u[IU]/mL (ref 0.35–5.50)

## 2023-09-15 LAB — POCT URINE PREGNANCY: Preg Test, Ur: NEGATIVE

## 2023-09-15 MED ORDER — NORGESTIMATE-ETH ESTRADIOL 0.25-35 MG-MCG PO TABS: 1.0000 | ORAL_TABLET | Freq: Every day | ORAL | 11 refills | Status: DC

## 2023-09-15 MED ORDER — MONTELUKAST SODIUM 10 MG PO TABS: 10.0000 mg | ORAL_TABLET | Freq: Every day | ORAL | 1 refills | Status: DC

## 2023-09-15 MED ORDER — PANTOPRAZOLE SODIUM 40 MG PO TBEC: 40.0000 mg | DELAYED_RELEASE_TABLET | Freq: Every day | ORAL | 1 refills | Status: AC

## 2023-09-15 MED ORDER — ALBUTEROL SULFATE HFA 108 (90 BASE) MCG/ACT IN AERS: 2.0000 | INHALATION_SPRAY | Freq: Four times a day (QID) | RESPIRATORY_TRACT | 2 refills | Status: AC | PRN

## 2023-09-15 MED ORDER — FLUTICASONE PROPIONATE 50 MCG/ACT NA SUSP: 1.0000 | Freq: Two times a day (BID) | NASAL | 1 refills | Status: AC

## 2023-09-15 NOTE — Progress Notes (Signed)
 New Patient Office Visit  Subjective    Patient ID: Teresa Durham, female    DOB: 04-May-1996  Age: 28 y.o. MRN: [id number]  CC:   Chief Complaint  Patient presents with   Abdominal Pain    Pain in left ovary and patient does state she has been diagnosed with PCOS, Very fatigue and insomnia. Patient states she does not feel like herself. Patient states she has had headaches as well.     HPI  Teresa Durham presents to establish care  28 year old person here for a worsening issue of fatigue during the day, shortness of breath with exertion.  She has a history of PCOS and has had some weight gain recently.  Works in Presenter, broadcasting for [employer].  He is an avid singer when she is not working.  No chest pain or chest pressure.  History of asthma but reports this is very well-controlled.  Has not had to use her inhaler and does not feel chest tightness, coughing, or wheezing.  She has heavy periods on a regular basis about every 4 weeks.  Usually last about 4 days.  Has not had an issue with iron deficiency in the past.  Previously was using birth control when she was diagnosed with PCOS but stopped taking this about a year ago.  Not currently trying to become pregnant, but may want to sometime in the next 5 years.  No recent major illnesses, no fevers or chills.  Denies any issues with mood, anxiety, or depression.  Does have some difficulty falling asleep, but denies racing thoughts.   Outpatient Encounter Medications as of 09/15/2023  Medication Sig   norgestimate-ethinyl estradiol (SPRINTEC 28) 0.25-35 MG-MCG tablet Take 1 tablet by mouth daily.   [DISCONTINUED] albuterol  (VENTOLIN  HFA) 108 (90 Base) MCG/ACT inhaler Inhale 2 puffs into the lungs every 6 (six) hours as needed for wheezing or shortness of breath.   [DISCONTINUED] amitriptyline (ELAVIL) 25 MG tablet Take by mouth.   [DISCONTINUED] amoxicillin -clavulanate (AUGMENTIN ) 875-125 MG tablet Take 1 tablet by mouth  every 12 (twelve) hours.   [DISCONTINUED] benzonatate  (TESSALON ) 100 MG capsule Take 1 capsule (100 mg total) by mouth 3 (three) times daily as needed for cough.   [DISCONTINUED] dicyclomine  (BENTYL ) 20 MG tablet Take 1 tablet (20 mg total) by mouth in the morning and at bedtime.   [DISCONTINUED] fluticasone (FLONASE) 50 MCG/ACT nasal spray Place 1 spray into both nostrils 2 (two) times daily.   [DISCONTINUED] ibuprofen  (ADVIL ) 800 MG tablet Take 1 tablet (800 mg total) by mouth every 8 (eight) hours as needed.   [DISCONTINUED] KAITLIB FE 0.8-25 MG-MCG tablet CHEW AND SWALLOW 1 (ONE) TABLET CHEWABLE DAILY   [DISCONTINUED] montelukast  (SINGULAIR ) 10 MG tablet Take 1 tablet (10 mg total) by mouth at bedtime.   [DISCONTINUED] Norethindrone-Ethinyl Estradiol-Fe Biphas (LO LOESTRIN FE) 1 MG-10 MCG / 10 MCG tablet Take by mouth.   [DISCONTINUED] ondansetron  (ZOFRAN -ODT) 4 MG disintegrating tablet 4mg  ODT q4 hours prn nausea/vomit   albuterol  (VENTOLIN  HFA) 108 (90 Base) MCG/ACT inhaler Inhale 2 puffs into the lungs every 6 (six) hours as needed for wheezing or shortness of breath.   fluticasone (FLONASE) 50 MCG/ACT nasal spray Place 1 spray into both nostrils 2 (two) times daily.   montelukast  (SINGULAIR ) 10 MG tablet Take 1 tablet (10 mg total) by mouth at bedtime.   [DISCONTINUED] oseltamivir  (TAMIFLU ) 75 MG capsule Take 1 capsule (75 mg total) by mouth 2 (two) times daily. (Patient not taking: Reported  on 09/15/2023)   [DISCONTINUED] pantoprazole  (PROTONIX ) 40 MG tablet Take 2 tablets (80 mg total) by mouth daily for 10 days.   No facility-administered encounter medications on file as of 09/15/2023.    Past Medical History:  Diagnosis Date   Asthma    PCOS (polycystic ovarian syndrome)     Past Surgical History:  Procedure Laterality Date   TONSILLECTOMY      History reviewed. No pertinent family history.  Social History   Socioeconomic History   Marital status: Single    Spouse name:  Not on file   Number of children: Not on file   Years of education: Not on file   Highest education level: Not on file  Occupational History   Not on file  Tobacco Use   Smoking status: Never   Smokeless tobacco: Never  Vaping Use   Vaping status: Never Used  Substance and Sexual Activity   Alcohol use: No   Drug use: No   Sexual activity: Yes    Partners: Male    Birth control/protection: Pill  Other Topics Concern   Not on file  Social History Narrative   Not on file   Social Drivers of Health   Financial Resource Strain: Low Risk  (05/22/2022)   Received from [facility], [facility]   Overall Financial Resource Strain (CARDIA)    Difficulty of Paying Living Expenses: Not hard at all  Food Insecurity: No Food Insecurity (05/22/2022)   Received from [facility], [facility]   Hunger Vital Sign    Worried About Running Out of Food in the Last Year: Never true    Ran Out of Food in the Last Year: Never true  Transportation Needs: No Transportation Needs (05/22/2022)   Received from [facility], [facility]   PRAPARE - Transportation    Lack of Transportation (Medical): No    Lack of Transportation (Non-Medical): No  Physical Activity: Not on file  Stress: Not on file  Social Connections: Unknown (09/28/2021)   Received from [facility], [facility]   Social Network    Social Network: Not on file  Intimate Partner Violence: Unknown (08/21/2021)   Received from [facility], [facility]   HITS    Physically Hurt: Not on file    Insult or Talk Down To: Not on file    Threaten Physical Harm: Not on file    Scream or Curse: Not on file         Objective    BP (!) 98/59   Pulse 80   Ht 5\' 3"  (1.6 m)   Wt 221 lb (100.2 kg)   SpO2 97%   BMI 39.15 kg/m   Physical Exam  Gen: Well-appearing woman Eyes: Normal, mild subconjunctival paler Ears: Normal bilateral tympanic membranes Neck: Mildly enlarged thyroid, no nodules, neck has an increased  circumference Heart: Regular, no murmur Lungs: Unlabored, clear throughout Abd: Soft, nontender, no organomegaly, I cannot replicate her right lower quadrant tenderness with light palpation Ext: Warm, no edema, normal joints Psych: Appropriate mood and affect, not anxious or depressed appearing       Assessment & Plan:   Problem List Items Addressed This Visit       Unprioritized   Asthma (Chronic)   Relevant Medications   albuterol  (VENTOLIN  HFA) 108 (90 Base) MCG/ACT inhaler   montelukast  (SINGULAIR ) 10 MG tablet   Contraception management (Chronic)   Relevant Medications   norgestimate-ethinyl estradiol (SPRINTEC 28) 0.25-35 MG-MCG tablet   Other Relevant  Orders   Ambulatory referral to Obstetrics / Gynecology   Obesity (BMI 35.0-39.9 without comorbidity) (Chronic)   Relevant Orders   TSH   POCT glycosylated hemoglobin (Hb A1C)   PCOS (polycystic ovarian syndrome) (Chronic)   Chronic and stable.  PCOS diagnosed several years ago with gynecology based on ultrasound findings of polycystic ovaries.  Having some discomfort on her right lower quadrant which she thinks is related to her ovaries.  We are going to restart oral contraception with Sprintec which should be beneficial with low-dose ethinyl estradiol and a nonandrogenic form of progesterone.  Will check A1c and lipids given the increased risk of metabolic syndrome.  Referral to gynecology for comanagement.      Relevant Orders   Lipid panel   Iron deficiency anemia - Primary   Symptoms of increasing fatigue and dyspnea on exertion seem most consistent with iron deficiency anemia in the setting of menorrhagia.  She also has pica.  Will check labs today.  If we confirm iron deficiency will start oral iron replacement.  Also starting hormonal contraceptive pills to treat the menorrhagia.  Will check TSH to rule out other causes of fatigue.  Does not seem to have issues with depressed mood or anxiety.      Allergic rhinitis    Relevant Medications   fluticasone (FLONASE) 50 MCG/ACT nasal spray   montelukast  (SINGULAIR ) 10 MG tablet   Other Visit Diagnoses       Screening for HIV (human immunodeficiency virus)       Relevant Orders   HIV Antibody (routine testing w rflx)     Encounter for HCV screening test for low risk patient       Relevant Orders   Hepatitis C antibody   Iron, TIBC and Ferritin Panel   CBC     Screening examination for STI       Relevant Orders   Cervicovaginal ancillary only       Return in about 6 months (around 03/16/2024) for weight management.   Ether Hercules, MD

## 2023-09-15 NOTE — Telephone Encounter (Signed)
 Chief Complaint: abd pain, fatigue, insomnia Symptoms: difficulty falling and staying asleep, RLQ abd pain, fatigue Frequency: ongoing/chronic, worse in the last week Pertinent Negatives: Patient denies fever, blood in stool Disposition: [] ED /[] Urgent Care (no appt availability in office) / [x] Appointment(In office/virtual)/ []  [facility]/ [] Home Care/ [] Refused Recommended Disposition /[] [facility]/ []  Follow-up with PCP Additional Notes: Pt states that she has been having ongoing difficulty with sleeping, fatigue, and abd pain. Pt states that she was dx with PCOS. Pt establishing with [facility], scheduled new pt visit today. Copied from CRM [id number]. Topic: Clinical - Red Word Triage >> Sep 15, 2023  9:29 AM Juluis Ok wrote: [facility] that prompted transfer to Nurse Triage: Vaginal pain, insomnia, fatigue Reason for Disposition . [1] MODERATE weakness (i.e., interferes with work, school, normal activities) AND [2] persists > 3 days  Answer Assessment - Initial Assessment Questions 1. DESCRIPTION: "Describe how you are feeling."     Causes insomnia, and has been correlated with abd pain 2. SEVERITY: "How bad is it?"  "Can you stand and walk?"   - MILD (0-3): Feels weak or tired, but does not interfere with work, school or normal activities.   - MODERATE (4-7): Able to stand and walk; weakness interferes with work, school, or normal activities.   - SEVERE (8-10): Unable to stand or walk; unable to do usual activities.     moderate 3. ONSET: "When did these symptoms begin?" (e.g., hours, days, weeks, months)     About a year 4. CAUSE: "What do you think is causing the weakness or fatigue?" (e.g., not drinking enough fluids, medical problem, trouble sleeping)     Unsure, has hx of pcos, pt unsure if it could be blood sugar related.  5. NEW MEDICINES:  "Have you started on any new medicines recently?" (e.g., opioid pain medicines, benzodiazepines, muscle relaxants,  antidepressants, antihistamines, neuroleptics, beta blockers)     montelukast  6. OTHER SYMPTOMS: "Do you have any other symptoms?" (e.g., chest pain, fever, cough, SOB, vomiting, diarrhea, bleeding, other areas of pain)     Pt states that she has noticed SOB at times, denies at this time 7. PREGNANCY: "Is there any chance you are pregnant?" "When was your last menstrual period?"     denies  Answer Assessment - Initial Assessment Questions 1. LOCATION: "Where does it hurt?"      R ovary 2. RADIATION: "Does the pain shoot anywhere else?" (e.g., chest, back)     Normally stays in one spot, but if moves-moves to the L side, which is then followed by period 3. ONSET: "When did the pain begin?" (e.g., minutes, hours or days ago)      Ongoing over about a year, worse over the last week 4. SUDDEN: "Gradual or sudden onset?"     gradual 5. PATTERN "Does the pain come and go, or is it constant?"    - If it comes and goes: "How long does it last?" "Do you have pain now?"     (Note: Comes and goes means the pain is intermittent. It goes away completely between bouts.)    - If constant: "Is it getting better, staying the same, or getting worse?"      (Note: Constant means the pain never goes away completely; most serious pain is constant and gets worse.)      constant 6. SEVERITY: "How bad is the pain?"  (e.g., Scale 1-10; mild, moderate, or severe)    - MILD (1-3): Doesn't interfere  with normal activities, abdomen soft and not tender to touch.     - MODERATE (4-7): Interferes with normal activities or awakens from sleep, abdomen tender to touch.     - SEVERE (8-10): Excruciating pain, doubled over, unable to do any normal activities.       5 consistently, worsens to 7 7. RECURRENT SYMPTOM: "Have you ever had this type of stomach pain before?" If Yes, ask: "When was the last time?" and "What happened that time?"      PCOS,  8. CAUSE: "What do you think is causing the stomach pain?"     Cysts on  ovary, hx of PCOS 9. RELIEVING/AGGRAVATING FACTORS: "What makes it better or worse?" (e.g., antacids, bending or twisting motion, bowel movement)     Pt states that 800-1000mg  of pain medications, uses advil  rarely.  10. OTHER SYMPTOMS: "Do you have any other symptoms?" (e.g., back pain, diarrhea, fever, urination pain, vomiting)       Pt states that she has recently had "small balls" stool 11. PREGNANCY: "Is there any chance you are pregnant?" "When was your last menstrual period?"       denies  Protocols used: Weakness (Generalized) and Fatigue-A-AH, Abdominal Pain - [facility]

## 2023-09-15 NOTE — Assessment & Plan Note (Signed)
 Symptoms of increasing fatigue and dyspnea on exertion seem most consistent with iron deficiency anemia in the setting of menorrhagia.  She also has pica.  Will check labs today.  If we confirm iron deficiency will start oral iron replacement.  Also starting hormonal contraceptive pills to treat the menorrhagia.  Will check TSH to rule out other causes of fatigue.  Does not seem to have issues with depressed mood or anxiety.

## 2023-09-15 NOTE — Assessment & Plan Note (Signed)
 Chronic and stable.  PCOS diagnosed several years ago with gynecology based on ultrasound findings of polycystic ovaries.  Having some discomfort on her right lower quadrant which she thinks is related to her ovaries.  We are going to restart oral contraception with Sprintec which should be beneficial with low-dose ethinyl estradiol and a nonandrogenic form of progesterone.  Will check A1c and lipids given the increased risk of metabolic syndrome.  Referral to gynecology for comanagement.

## 2023-09-16 LAB — CERVICOVAGINAL ANCILLARY ONLY
Bacterial Vaginitis (gardnerella): NEGATIVE
Candida Glabrata: NEGATIVE
Candida Vaginitis: NEGATIVE
Chlamydia: NEGATIVE
Comment: NEGATIVE
Comment: NEGATIVE
Comment: NEGATIVE
Comment: NEGATIVE
Comment: NEGATIVE
Comment: NORMAL
Neisseria Gonorrhea: NEGATIVE
Trichomonas: NEGATIVE

## 2023-09-16 LAB — IRON,TIBC AND FERRITIN PANEL
%SAT: 24 % (ref 16–45)
Ferritin: 116 ng/mL (ref 16–154)
Iron: 83 ug/dL (ref 40–190)
TIBC: 342 ug/dL (ref 250–450)

## 2023-09-16 LAB — HEPATITIS C ANTIBODY: Hepatitis C Ab: NONREACTIVE

## 2023-09-16 LAB — HIV ANTIBODY (ROUTINE TESTING W REFLEX): HIV 1&2 Ab, 4th Generation: NONREACTIVE

## 2023-09-21 ENCOUNTER — Encounter: Admitting: Obstetrics and Gynecology

## 2023-09-21 DIAGNOSIS — Z7689 Persons encountering health services in other specified circumstances: Secondary | ICD-10-CM

## 2023-09-21 DIAGNOSIS — Z8742 Personal history of other diseases of the female genital tract: Secondary | ICD-10-CM

## 2023-09-23 ENCOUNTER — Encounter: Payer: Self-pay | Admitting: Obstetrics and Gynecology

## 2023-09-27 ENCOUNTER — Encounter: Payer: Self-pay | Admitting: Obstetrics and Gynecology

## 2023-10-15 ENCOUNTER — Encounter: Admitting: Student in an Organized Health Care Education/Training Program

## 2023-11-04 ENCOUNTER — Ambulatory Visit: Payer: Self-pay | Admitting: *Deleted

## 2023-11-04 ENCOUNTER — Ambulatory Visit (INDEPENDENT_AMBULATORY_CARE_PROVIDER_SITE_OTHER): Admitting: Family Medicine

## 2023-11-04 ENCOUNTER — Encounter: Payer: Self-pay | Admitting: Family Medicine

## 2023-11-04 VITALS — BP 100/80 | HR 96 | Temp 98.4°F | Ht 63.0 in | Wt 221.0 lb

## 2023-11-04 DIAGNOSIS — J301 Allergic rhinitis due to pollen: Secondary | ICD-10-CM

## 2023-11-04 DIAGNOSIS — J4520 Mild intermittent asthma, uncomplicated: Secondary | ICD-10-CM | POA: Diagnosis not present

## 2023-11-04 DIAGNOSIS — J069 Acute upper respiratory infection, unspecified: Secondary | ICD-10-CM | POA: Diagnosis not present

## 2023-11-04 DIAGNOSIS — R12 Heartburn: Secondary | ICD-10-CM | POA: Diagnosis not present

## 2023-11-04 DIAGNOSIS — R051 Acute cough: Secondary | ICD-10-CM | POA: Diagnosis not present

## 2023-11-04 LAB — POC COVID19 BINAXNOW: SARS Coronavirus 2 Ag: NEGATIVE

## 2023-11-04 LAB — POC INFLUENZA A&B (BINAX/QUICKVUE)
Influenza A, POC: NEGATIVE
Influenza B, POC: NEGATIVE

## 2023-11-04 MED ORDER — MONTELUKAST SODIUM 10 MG PO TABS: 10.0000 mg | ORAL_TABLET | Freq: Every day | ORAL | 1 refills | Status: AC

## 2023-11-04 MED ORDER — BENZONATATE 100 MG PO CAPS
100.0000 mg | ORAL_CAPSULE | Freq: Three times a day (TID) | ORAL | 0 refills | Status: AC | PRN
Start: 2023-11-04 — End: ?

## 2023-11-04 NOTE — Progress Notes (Signed)
 Subjective:  Patient ID: Teresa Durham, female    DOB: 1995-05-20  Age: 28 y.o. MRN: [id number]  CC:  Chief Complaint  Patient presents with   URI    Runny nose, chest soreness, coughing up clear mucus, short of breath, stomach and esophagus  burning all symptoms started yesterday/ pt has tried a home remedy/ patient as tested herself for covid and it hs came back negative may still be to early     HPI Breasia W Eick presents for acute visit for above, PCP is Dr. Gayl Katos.  PMH noted including history of asthma treated with Singulair , albuterol  as needed and Flonase  for allergies.    Cough, congestion Woke up with symptoms 5am yesterday. As above, yesterday with runny nose, soreness in her chest, cough and clear mucus.  Burning sensation in her stomach and throat.  Home COVID test was negative yesterday.  Took tums, pantoprazole , then teaspoon of baking soda for acid reflux - that resolved stomach issues. Acid/stomach feels better, some heartburn.  Had nausea with heartburn, no vomiting, diarrhea. Did have small stools recently.  No fever.  Persistent dry cough last night, felt some wheeze short of breath this am with walking up steps.  Taking deeper breaths with talking at work today. Has not used albuterol . Has flonase  as needed - not taken recently, taking singulair  daily with zyrtec daily. PPI every day.          History Patient Active Problem List   Diagnosis Date Noted   Obesity (BMI 35.0-39.9 without comorbidity) 09/15/2023   PCOS (polycystic ovarian syndrome) 09/15/2023   Iron deficiency anemia 09/15/2023   Allergic rhinitis 09/15/2023   Asthma 04/04/2013   Contraception management 12/07/2011   Past Medical History:  Diagnosis Date   Asthma    PCOS (polycystic ovarian syndrome)    Past Surgical History:  Procedure Laterality Date   TONSILLECTOMY     Allergies  Allergen Reactions   Sumatriptan Other (See Comments)    Made migraines worse   Prior to  Admission medications   Medication Sig Start Date End Date Taking? Authorizing Provider  albuterol  (VENTOLIN  HFA) 108 (90 Base) MCG/ACT inhaler Inhale 2 puffs into the lungs every 6 (six) hours as needed for wheezing or shortness of breath. 09/15/23  Yes Ether Hercules, MD  fluticasone  (FLONASE ) 50 MCG/ACT nasal spray Place 1 spray into both nostrils 2 (two) times daily. 09/15/23  Yes Ether Hercules, MD  montelukast  (SINGULAIR ) 10 MG tablet Take 1 tablet (10 mg total) by mouth at bedtime. 09/15/23  Yes Ether Hercules, MD  norgestimate -ethinyl estradiol  (SPRINTEC 28) 0.25-35 MG-MCG tablet Take 1 tablet by mouth daily. 09/15/23  Yes Ether Hercules, MD  pantoprazole  (PROTONIX ) 40 MG tablet Take 1 tablet (40 mg total) by mouth daily. 09/15/23  Yes Ether Hercules, MD   Social History   Socioeconomic History   Marital status: Single    Spouse name: Not on file   Number of children: Not on file   Years of education: Not on file   Highest education level: Not on file  Occupational History   Not on file  Tobacco Use   Smoking status: Never   Smokeless tobacco: Never  Vaping Use   Vaping status: Never Used  Substance and Sexual Activity   Alcohol use: No   Drug use: No   Sexual activity: Yes    Partners: Male    Birth control/protection: Pill  Other Topics Concern   Not on  file  Social History Narrative   Not on file   Social Drivers of Health   Financial Resource Strain: Low Risk  (05/22/2022)   Received from [facility]   Overall Financial Resource Strain (CARDIA)    Difficulty of Paying Living Expenses: Not hard at all  Food Insecurity: No Food Insecurity (05/22/2022)   Received from [facility]   Hunger Vital Sign    Within the past 12 months, you worried that your food would run out before you got the money to buy more.: Never true    Within the past 12 months, the food you bought just didn't last and you didn't have money to get more.: Never  true  Transportation Needs: No Transportation Needs (05/22/2022)   Received from [facility] - Transportation    Lack of Transportation (Medical): No    Lack of Transportation (Non-Medical): No  Physical Activity: Not on file  Stress: Not on file  Social Connections: Unknown (09/28/2021)   Received from [facility]   Social Network    Social Network: Not on file  Intimate Partner Violence: Unknown (08/21/2021)   Received from [facility]   HITS    Physically Hurt: Not on file    Insult or Talk Down To: Not on file    Threaten Physical Harm: Not on file    Scream or Curse: Not on file    Review of Systems   Objective:   Vitals:   11/04/23 1129  BP: 100/80  Pulse: 96  Temp: 98.4 F (36.9 C)  TempSrc: Oral  SpO2: 94%  Weight: 221 lb (100.2 kg)  Height: 5' 3 (1.6 m)     Physical Exam Vitals reviewed.  Constitutional:      General: She is not in acute distress.    Appearance: She is well-developed.  HENT:     Head: Normocephalic and atraumatic.     Right Ear: Hearing, tympanic membrane, ear canal and external ear normal.     Left Ear: Hearing, tympanic membrane, ear canal and external ear normal.     Nose: Nose normal.     Mouth/Throat:     Pharynx: No posterior oropharyngeal erythema.   Eyes:     Conjunctiva/sclera: Conjunctivae normal.     Pupils: Pupils are equal, round, and reactive to light.    Cardiovascular:     Rate and Rhythm: Normal rate and regular rhythm.     Heart sounds: Normal heart sounds. No murmur heard. Pulmonary:     Effort: Pulmonary effort is normal. No respiratory distress.     Breath sounds: Normal breath sounds. No wheezing or rhonchi.     Comments: Lungs clear, no wheeze on exam, no wheeze with forced expiration.  Speaking in full sentences.  No distress.  No rhonchi/rales appreciated.  Skin:    General: Skin is warm and dry.     Findings: No rash.   Neurological:     Mental Status: She is alert and oriented to  person, place, and time.   Psychiatric:        Mood and Affect: Mood normal.        Behavior: Behavior normal.    Results for orders placed or performed in visit on 11/04/23  POC Influenza A&B(BINAX/QUICKVUE)   Collection Time: 11/04/23 12:09 PM  Result Value Ref Range   Influenza A, POC Negative Negative   Influenza B, POC Negative Negative  POC COVID-19 BinaxNow   Collection Time: 11/04/23 12:09 PM  Result  Value Ref Range   SARS Coronavirus 2 Ag Negative Negative     Assessment & Plan:  NYOMIE EHRLICH is a 29 y.o. female . Mild intermittent asthma, unspecified whether complicated  Acute cough - Plan: POC Influenza A&B(BINAX/QUICKVUE), POC COVID-19 BinaxNow, benzonatate  (TESSALON ) 100 MG capsule  Upper respiratory tract infection, unspecified type - Plan: POC Influenza A&B(BINAX/QUICKVUE), POC COVID-19 BinaxNow  Heartburn  Seasonal allergic rhinitis due to pollen - Plan: montelukast  (SINGULAIR ) 10 MG tablet  Suspected viral URI.  COVID/flu testing negative.  Possible false testing due to early symptoms discussed, but mild symptoms at present.  Flare of heartburn/cough has improved with treatment above, continue PPI.  Mild flare of asthma with wheezing, dyspnea earlier but ambulatory pulse ox 98% in office, reassuring exam without audible wheeze and normal respiratory effort.  Continued symptomatic care discussed, recommended she restart Flonase  for the nasal congestion, continue PPI for heartburn, continue Singulair  for asthma, allergies along with the antihistamine over-the-counter, and albuterol  if needed for wheeze with RTC precautions discussed.  Hold on steroids, antibiotics for now.  Tessalon  Perles if needed for cough.  All questions answered.   Meds ordered this encounter  Medications   benzonatate  (TESSALON ) 100 MG capsule    Sig: Take 1 capsule (100 mg total) by mouth 3 (three) times daily as needed for cough.    Dispense:  20 capsule    Refill:  0   montelukast   (SINGULAIR ) 10 MG tablet    Sig: Take 1 tablet (10 mg total) by mouth at bedtime.    Dispense:  90 tablet    Refill:  1   Patient Instructions  COVID and flu test were also normal/negative here in the office.  As we discussed sometimes that can be a false negative early in the course of illness, there are over-the-counter test if you wanted to recheck in the next day or 2 but I suspect those will be negative.  I suspect you picked up a virus, and that should improve with symptomatic care.  I do not think antibiotics are needed at this time.  Asthma can sometimes flare with a virus as we discussed.   Tessalon  Perles as needed for cough during the day.  Albuterol  if you are having wheezing, but if you require that more than a few times per day, or persistently require that beyond 2 to 3 days, let me know and we can look at other medications such as a steroid. I do not think that is needed at this time.  Continue heartburn medications.  Controlling some of the postnasal drip and coughing should help with heartburn symptoms as well.  You can try restarting the Flonase  to see if that will help with some of the nasal congestion and postnasal drip.  I expect your symptoms will improve within 5 to 7 days, but please be seen if any new or worsening symptoms.  Hang in there!    Signed,   Caro Christmas, MD [facility], [facility] 11/04/23 12:31 PM

## 2023-11-04 NOTE — Patient Instructions (Addendum)
 COVID and flu test were also normal/negative here in the office.  As we discussed sometimes that can be a false negative early in the course of illness, there are over-the-counter test if you wanted to recheck in the next day or 2 but I suspect those will be negative.  I suspect you picked up a virus, and that should improve with symptomatic care.  I do not think antibiotics are needed at this time.  Asthma can sometimes flare with a virus as we discussed.   Tessalon  Perles as needed for cough during the day.  Albuterol  if you are having wheezing, but if you require that more than a few times per day, or persistently require that beyond 2 to 3 days, let me know and we can look at other medications such as a steroid. I do not think that is needed at this time.  Continue heartburn medications.  Controlling some of the postnasal drip and coughing should help with heartburn symptoms as well.  You can try restarting the Flonase  to see if that will help with some of the nasal congestion and postnasal drip.  I expect your symptoms will improve within 5 to 7 days, but please be seen if any new or worsening symptoms.  Hang in there!

## 2023-11-04 NOTE — Telephone Encounter (Signed)
 Copied from CRM [id number]. Topic: Clinical - Red Word Triage >> Nov 04, 2023  8:55 AM Kita Perish H wrote: [facility] that prompted transfer to Nurse Triage: Woke up with stomach and esophagus burning yesterday, and now today has cough, sinus and short of breath, chest stills feels sore Reason for Disposition  [1] Continuous (nonstop) coughing interferes with work or school AND [2] no improvement using cough treatment per Care Advice  Answer Assessment - Initial Assessment Questions 1. ONSET: When did the cough begin?      I'm coughing up clear mucus.   It all started yesterday.   I woke up feeling bad.   I also have a burning sensation in my throat.   I took some baking soda and it helped.   All day yesterday my stomach and esophagus burned.  I developed a cough, chest soreness, sinus congestion.   The cough got worse last night.    This morning my nose is runny and I'm feeling short of breath. 2. SEVERITY: How bad is the cough today?      It's gotten worse  3. SPUTUM: Describe the color of your sputum (none, dry cough; clear, Kor[place], yellow, green)     Clear mucus 4. HEMOPTYSIS: Are you coughing up any blood? If so ask: How much? (flecks, streaks, tablespoons, etc.)     Not asked 5. DIFFICULTY BREATHING: Are you having difficulty breathing? If Yes, ask: How bad is it? (e.g., mild, moderate, severe)    - MILD: No SOB at rest, mild SOB with walking, speaks normally in sentences, can lie down, no retractions, pulse < 100.    - MODERATE: SOB at rest, SOB with minimal exertion and prefers to sit, cannot lie down flat, speaks in phrases, mild retractions, audible wheezing, pulse 100-120.    - SEVERE: Very SOB at rest, speaks in single words, struggling to breathe, sitting hunched forward, retractions, pulse > 120      I'm short of breath when I'm talking.   When I came up the steps this morning I was short of breath.    6. FEVER: Do you have a fever? If Yes, ask: What is your temperature,  how was it measured, and when did it start?     No 7. CARDIAC HISTORY: Do you have any history of heart disease? (e.g., heart attack, congestive heart failure)      Not asked 8. LUNG HISTORY: Do you have any history of lung disease?  (e.g., pulmonary embolus, asthma, emphysema)     Asthma as a kid only.  9. PE RISK FACTORS: Do you have a history of blood clots? (or: recent major surgery, recent prolonged travel, bedridden)     Not asked 10. OTHER SYMPTOMS: Do you have any other symptoms? (e.g., runny nose, wheezing, chest pain)       Runny nose, sinus congestion, chest soreness 11. PREGNANCY: Is there any chance you are pregnant? When was your last menstrual period?       Not asked 12. TRAVEL: Have you traveled out of the country in the last month? (e.g., travel history, exposures)       N/A  Protocols used: Cough - Acute Productive-A-AH FYI Only or Action Required?: FYI only for provider.  Patient was last seen in primary care on 09/15/2023 by Ether Hercules, MD. Called Nurse Triage reporting Cough. Symptoms began yesterday. Interventions attempted: Nothing. Symptoms are: gradually worsening .  Triage Disposition: See Physician Within 24 Hours  Patient/caregiver understands and will  follow disposition?: Yes

## 2023-11-08 ENCOUNTER — Telehealth: Payer: Self-pay

## 2023-11-08 MED ORDER — ONDANSETRON HCL 4 MG PO TABS: 4.0000 mg | ORAL_TABLET | Freq: Two times a day (BID) | ORAL | 0 refills | Status: DC | PRN

## 2023-11-08 NOTE — Telephone Encounter (Signed)
 Copied from CRM [id number]. Topic: Clinical - Medical Advice >> Nov 08, 2023  3:15 PM Teresa Durham I wrote: Reason for CRM: Patient wants to know what to do about phlegm on stomach sates it makes her very nauseous like border line throw up but never throws up

## 2023-11-08 NOTE — Telephone Encounter (Signed)
 Pt was seen on the 19th please advise does she need another visit?

## 2023-11-08 NOTE — Telephone Encounter (Signed)
 I spoke with the patient by phone.  Symptoms seem most consistent with worsening reflux.  We talked about increasing the Protonix  to 40 mg twice a day.  I prescribed a short course of Zofran  which has been helpful for her in the past.  She is having some increased nasal drainage, postnasal drip may be exacerbating this.  I asked her to increase Flonase  to twice a day application.  I recommended intranasal saline irrigation, but she has struggled with this in the past.

## 2023-11-12 ENCOUNTER — Encounter: Admitting: Student in an Organized Health Care Education/Training Program

## 2023-12-06 NOTE — Progress Notes (Unsigned)
 GYNECOLOGY PROGRESS NOTE: NEW PATIENT  Subjective:  PCP: Jerrell Cleatus Ned, MD  Patient ID: Teresa Durham, female    DOB: Jun 17, 1995, 28 y.o.   MRN: [id number]  HPI  Patient is a 28 y.o. G0P0000 female who was referred by Dr. Jerrell of [facility] for comanagement of pt's PCOS. She established with him 09/15/23 and was started on Sprintec at that time, which she is tolerating well, concerned about some breakthrough bleeding for the past two months, light spotting, doesn't bother her much. She is more concerned about RLQ pain for the past 6-7 mos that feels like a menstrual cramp, she is used to it but also worried that her PCOS is worsening.   Period Cycle (Days): 28 Period Duration (Days): 4 Period Pattern: Regular Menstrual Flow: Moderate Menstrual Control: Maxi pad Menstrual Control Change Freq (Hours): 3 Dysmenorrhea: (!) Moderate Dysmenorrhea Symptoms: Cramping, Throbbing  OB History  Gravida Para Term Preterm AB Living  0 0 0 0 0 0  SAB IAB Ectopic Multiple Live Births  0 0 0 0 0   Past Medical History:  Diagnosis Date   Asthma    PCOS (polycystic ovarian syndrome)    Patient Active Problem List   Diagnosis Date Noted   Obesity (BMI 35.0-39.9 without comorbidity) 09/15/2023   PCOS (polycystic ovarian syndrome) 09/15/2023   Iron deficiency anemia 09/15/2023   Allergic rhinitis 09/15/2023   Asthma 04/04/2013   Contraception management 12/07/2011   Past Surgical History:  Procedure Laterality Date   TONSILLECTOMY     History reviewed. No pertinent family history.  Social History   Socioeconomic History   Marital status: Single    Spouse name: Not on file   Number of children: Not on file   Years of education: Not on file   Highest education level: Not on file  Occupational History   Not on file  Tobacco Use   Smoking status: Never   Smokeless tobacco: Never  Vaping Use   Vaping status: Never Used  Substance and Sexual Activity    Alcohol use: No   Drug use: No   Sexual activity: Yes    Partners: Male    Birth control/protection: Pill  Other Topics Concern   Not on file  Social History Narrative   Not on file   Social Drivers of Health   Financial Resource Strain: Low Risk  (05/22/2022)   Received from [facility]   Overall Financial Resource Strain (CARDIA)    Difficulty of Paying Living Expenses: Not hard at all  Food Insecurity: No Food Insecurity (05/22/2022)   Received from [facility]   Hunger Vital Sign    Within the past 12 months, you worried that your food would run out before you got the money to buy more.: Never true    Within the past 12 months, the food you bought just didn't last and you didn't have money to get more.: Never true  Transportation Needs: No Transportation Needs (05/22/2022)   Received from [facility] - Transportation    Lack of Transportation (Medical): No    Lack of Transportation (Non-Medical): No  Physical Activity: Not on file  Stress: Not on file  Social Connections: Unknown (09/28/2021)   Received from [facility]   Social Network    Social Network: Not on file  Intimate Partner Violence: Unknown (08/21/2021)   Received from [facility]   HITS    Physically Hurt: Not on file  Insult or Talk Down To: Not on file    Threaten Physical Harm: Not on file    Scream or Curse: Not on file   Allergies  Allergen Reactions   Sumatriptan Other (See Comments)    Made migraines worse   Current Outpatient Medications on File Prior to Visit  Medication Sig Dispense Refill   albuterol  (VENTOLIN  HFA) 108 (90 Base) MCG/ACT inhaler Inhale 2 puffs into the lungs every 6 (six) hours as needed for wheezing or shortness of breath. 8 g 2   fluticasone  (FLONASE ) 50 MCG/ACT nasal spray Place 1 spray into both nostrils 2 (two) times daily. 16 g 1   montelukast  (SINGULAIR ) 10 MG tablet Take 1 tablet (10 mg total) by mouth at bedtime. 90 tablet 1   norgestimate -ethinyl  estradiol  (SPRINTEC 28) 0.25-35 MG-MCG tablet Take 1 tablet by mouth daily. 28 tablet 11   ondansetron  (ZOFRAN ) 4 MG tablet Take 1 tablet (4 mg total) by mouth 2 (two) times daily as needed for nausea or vomiting. 20 tablet 0   pantoprazole  (PROTONIX ) 40 MG tablet Take 1 tablet (40 mg total) by mouth daily. 90 tablet 1   benzonatate  (TESSALON ) 100 MG capsule Take 1 capsule (100 mg total) by mouth 3 (three) times daily as needed for cough. (Patient not taking: Reported on 12/08/2023) 20 capsule 0   No current facility-administered medications on file prior to visit.    Review of Systems Pertinent items are noted in HPI.   Objective:   Blood pressure 109/63, pulse 68, height 5' 3.25 (1.607 m), weight 219 lb (99.3 kg). Body mass index is 38.49 kg/m.  General appearance: alert, cooperative, no distress, and morbidly obese Abdomen: soft, non-tender; bowel sounds normal; no masses,  no organomegaly Pelvic: cervix normal in appearance, external genitalia normal, no adnexal masses or tenderness, no cervical motion tenderness, rectovaginal septum normal, uterus normal size, shape, and consistency, and vagina normal without discharge Extremities: extremities normal, atraumatic, no cyanosis or edema Neurologic: Grossly normal   Assessment/Plan:   1. Pelvic pain   2. Pain, abdominal, RLQ   3. PCOS (polycystic ovarian syndrome)   4. Obesity (BMI 35.0-39.9 without comorbidity)   Teresa Durham is a 28 y.o. G0P0000 with chronic PCOS (outside diagnosis in 2018-2019) now with some pelvic pain favoring her RLQ, periods well-controlled on Sprintec, but with some BTB.  -Will obtain pelvic US  to assess potential source of pain -Continue Sprintec, reassurance re: BTB, especially this early in course; try to take for at least 6 mos to allow synchronization with body's cycles.  -F/u in 6 mos for annual/pap, sooner prn     Estil Mangle, DO [facility]

## 2023-12-08 ENCOUNTER — Encounter: Payer: Self-pay | Admitting: Obstetrics

## 2023-12-08 ENCOUNTER — Ambulatory Visit: Admitting: Obstetrics

## 2023-12-08 VITALS — BP 109/63 | HR 68 | Ht 63.25 in | Wt 219.0 lb

## 2023-12-08 DIAGNOSIS — R1031 Right lower quadrant pain: Secondary | ICD-10-CM | POA: Diagnosis not present

## 2023-12-08 DIAGNOSIS — R102 Pelvic and perineal pain: Secondary | ICD-10-CM

## 2023-12-08 DIAGNOSIS — E282 Polycystic ovarian syndrome: Secondary | ICD-10-CM

## 2023-12-08 DIAGNOSIS — E669 Obesity, unspecified: Secondary | ICD-10-CM

## 2023-12-29 ENCOUNTER — Telehealth: Payer: Self-pay | Admitting: Obstetrics

## 2023-12-29 ENCOUNTER — Other Ambulatory Visit

## 2023-12-29 NOTE — Telephone Encounter (Signed)
 Reached out to pt to reschedule pelvic US  that was scheduled on 12/29/2023 at 2:00 (per Dr. Leigh).  Was able to reschedule the appt to 01/31/2024 at 11:!5.

## 2024-01-31 ENCOUNTER — Ambulatory Visit (INDEPENDENT_AMBULATORY_CARE_PROVIDER_SITE_OTHER)

## 2024-01-31 DIAGNOSIS — R1031 Right lower quadrant pain: Secondary | ICD-10-CM

## 2024-01-31 DIAGNOSIS — R102 Pelvic and perineal pain: Secondary | ICD-10-CM

## 2024-02-04 ENCOUNTER — Telehealth: Payer: Self-pay

## 2024-02-04 NOTE — Telephone Encounter (Signed)
 Patient called about U/S results. She was told that she would be contacted in two days in regards to her results.

## 2024-02-18 ENCOUNTER — Ambulatory Visit: Payer: Self-pay | Admitting: Obstetrics

## 2024-03-16 ENCOUNTER — Ambulatory Visit: Admitting: Student in an Organized Health Care Education/Training Program

## 2024-04-26 ENCOUNTER — Other Ambulatory Visit: Payer: Self-pay | Admitting: Student in an Organized Health Care Education/Training Program

## 2024-04-26 DIAGNOSIS — Z309 Encounter for contraceptive management, unspecified: Secondary | ICD-10-CM

## 2024-05-19 ENCOUNTER — Other Ambulatory Visit: Payer: Self-pay | Admitting: Student in an Organized Health Care Education/Training Program

## 2024-05-19 MED ORDER — ONDANSETRON HCL 4 MG PO TABS: 4.0000 mg | ORAL_TABLET | Freq: Two times a day (BID) | ORAL | 0 refills | Status: AC | PRN

## 2024-05-19 NOTE — Telephone Encounter (Signed)
 Copied from CRM [id number]. Topic: Clinical - Medication Refill >> May 19, 2024 11:24 AM Ahlexyia S wrote: Medication: ondansetron  (ZOFRAN ) 4 MG tablet  Has the patient contacted their pharmacy? No, pt stated that the listed medication does not pop up in her current med list. (Agent: If no, request that the patient contact the pharmacy for the refill. If patient does not wish to contact the pharmacy document the reason why and proceed with request.) (Agent: If yes, when and what did the pharmacy advise?)  This is the patient's preferred pharmacy:  [facility] [address], [place] - [address] [place] [address] Phone: [phone] Fax: [phone]  Is this the correct pharmacy for this prescription? Yes If no, delete pharmacy and type the correct one.   Has the prescription been filled recently? No  Is the patient out of the medication? Yes  Has the patient been seen for an appointment in the last year OR does the patient have an upcoming appointment? Yes  Can we respond through MyChart? Yes  Agent: Please be advised that Rx refills may take up to 3 business days. We ask that you follow-up with your pharmacy.

## 2024-05-19 NOTE — Telephone Encounter (Signed)
"  Okay to refill?  "
